# Patient Record
Sex: Male | Born: 1940 | Hispanic: No | Marital: Married | Smoking: Never smoker
Health system: Southern US, Community
[De-identification: ages and names within clinical notes are randomized; demographics above are authoritative.]

## PROBLEM LIST (undated history)

## (undated) DIAGNOSIS — I10 Essential (primary) hypertension: Secondary | ICD-10-CM

## (undated) DIAGNOSIS — Z974 Presence of external hearing-aid: Secondary | ICD-10-CM

## (undated) DIAGNOSIS — K635 Polyp of colon: Secondary | ICD-10-CM

## (undated) DIAGNOSIS — I499 Cardiac arrhythmia, unspecified: Secondary | ICD-10-CM

## (undated) DIAGNOSIS — K649 Unspecified hemorrhoids: Secondary | ICD-10-CM

## (undated) DIAGNOSIS — I4891 Unspecified atrial fibrillation: Secondary | ICD-10-CM

## (undated) DIAGNOSIS — M1990 Unspecified osteoarthritis, unspecified site: Secondary | ICD-10-CM

## (undated) DIAGNOSIS — C4490 Unspecified malignant neoplasm of skin, unspecified: Secondary | ICD-10-CM

## (undated) DIAGNOSIS — G4730 Sleep apnea, unspecified: Secondary | ICD-10-CM

## (undated) DIAGNOSIS — H905 Unspecified sensorineural hearing loss: Secondary | ICD-10-CM

## (undated) DIAGNOSIS — N433 Hydrocele, unspecified: Secondary | ICD-10-CM

## (undated) HISTORY — DX: Unspecified hemorrhoids: K64.9

## (undated) HISTORY — DX: Polyp of colon: K63.5

## (undated) HISTORY — DX: Unspecified malignant neoplasm of skin, unspecified: C44.90

## (undated) HISTORY — PX: TONSILLECTOMY: SUR1361

## (undated) HISTORY — DX: Sleep apnea, unspecified: G47.30

## (undated) HISTORY — PX: HYDROCELE EXCISION / REPAIR: SUR1145

## (undated) HISTORY — PX: MOHS SURGERY: SUR867

## (undated) HISTORY — DX: Unspecified sensorineural hearing loss: H90.5

---
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
## 1998-04-25 DIAGNOSIS — G4730 Sleep apnea, unspecified: Secondary | ICD-10-CM

## 1998-04-25 HISTORY — DX: Sleep apnea, unspecified: G47.30

## 2005-04-25 HISTORY — PX: CHOLECYSTECTOMY: SHX55

## 2010-04-25 HISTORY — PX: COLONOSCOPY: SHX174

## 2010-10-01 ENCOUNTER — Ambulatory Visit: Payer: Self-pay | Admitting: Gastroenterology

## 2010-10-01 DIAGNOSIS — I499 Cardiac arrhythmia, unspecified: Secondary | ICD-10-CM

## 2010-10-05 LAB — PATHOLOGY REPORT

## 2014-11-08 ENCOUNTER — Emergency Department
Admission: EM | Admit: 2014-11-08 | Discharge: 2014-11-09 | Disposition: A | Discharge status: Home / Self Care | Payer: Medicare HMO | Attending: Emergency Medicine | Admitting: Emergency Medicine

## 2014-11-08 ENCOUNTER — Other Ambulatory Visit: Payer: Self-pay

## 2014-11-08 ENCOUNTER — Encounter: Payer: Self-pay | Admitting: Emergency Medicine

## 2014-11-08 DIAGNOSIS — M545 Low back pain, unspecified: Secondary | ICD-10-CM

## 2014-11-08 DIAGNOSIS — Z79899 Other long term (current) drug therapy: Secondary | ICD-10-CM | POA: Diagnosis not present

## 2014-11-08 DIAGNOSIS — R1084 Generalized abdominal pain: Secondary | ICD-10-CM | POA: Diagnosis not present

## 2014-11-08 HISTORY — DX: Unspecified atrial fibrillation: I48.91

## 2014-11-08 LAB — COMPREHENSIVE METABOLIC PANEL
ALK PHOS: 89 U/L (ref 38–126)
ALT: 28 U/L (ref 17–63)
AST: 28 U/L (ref 15–41)
Albumin: 4.4 g/dL (ref 3.5–5.0)
Anion gap: 9 (ref 5–15)
BUN: 16 mg/dL (ref 6–20)
CO2: 26 mmol/L (ref 22–32)
Calcium: 9.1 mg/dL (ref 8.9–10.3)
Chloride: 100 mmol/L — ABNORMAL LOW (ref 101–111)
Creatinine, Ser: 0.81 mg/dL (ref 0.61–1.24)
GFR calc Af Amer: >60 mL/min (ref >60)
GFR calc non Af Amer: >60 mL/min (ref >60)
GLUCOSE: 113 mg/dL — AB (ref 65–99)
POTASSIUM: 3.5 mmol/L (ref 3.5–5.1)
SODIUM: 135 mmol/L (ref 135–145)
TOTAL PROTEIN: 7.7 g/dL (ref 6.5–8.1)
Total Bilirubin: 1 mg/dL (ref 0.3–1.2)

## 2014-11-08 LAB — URINALYSIS COMPLETE WITH MICROSCOPIC (ARMC ONLY)
Bacteria, UA: NONE SEEN
Bilirubin Urine: NEGATIVE
Glucose, UA: NEGATIVE mg/dL
Hgb urine dipstick: NEGATIVE
KETONES UR: NEGATIVE mg/dL
Leukocytes, UA: NEGATIVE
NITRITE: NEGATIVE
PH: 7 (ref 5.0–8.0)
PROTEIN: NEGATIVE mg/dL
Specific Gravity, Urine: 1.012 (ref 1.005–1.030)
Squamous Epithelial / LPF: NONE SEEN
WBC UA: NONE SEEN WBC/hpf (ref 0–5)

## 2014-11-08 LAB — CBC WITH DIFFERENTIAL/PLATELET
BASOS ABS: 0.1 10*3/uL (ref 0–0.1)
Basophils Relative: 1 %
EOS PCT: 1 %
Eosinophils Absolute: 0.1 10*3/uL (ref 0–0.7)
HEMATOCRIT: 43.4 % (ref 40.0–52.0)
Hemoglobin: 15.1 g/dL (ref 13.0–18.0)
LYMPHS ABS: 1.6 10*3/uL (ref 1.0–3.6)
LYMPHS PCT: 21 %
MCH: 30 pg (ref 26.0–34.0)
MCHC: 34.8 g/dL (ref 32.0–36.0)
MCV: 86.3 fL (ref 80.0–100.0)
Monocytes Absolute: 0.9 10*3/uL (ref 0.2–1.0)
Monocytes Relative: 12 %
NEUTROS ABS: 5.1 10*3/uL (ref 1.4–6.5)
Neutrophils Relative %: 65 %
Platelets: 148 10*3/uL — ABNORMAL LOW (ref 150–440)
RBC: 5.03 MIL/uL (ref 4.40–5.90)
RDW: 15.7 % — ABNORMAL HIGH (ref 11.5–14.5)
WBC: 7.9 10*3/uL (ref 3.8–10.6)

## 2014-11-08 LAB — APTT: aPTT: 44 seconds — ABNORMAL HIGH (ref 24–36)

## 2014-11-08 LAB — TROPONIN I: Troponin I: <0.03 ng/mL (ref <0.031)

## 2014-11-08 NOTE — ED Notes (Signed)
Pt presents to ER alert and in NAD. Pt states right side back pain, denies injury. Pt reports pain radiates to other side. Pt also reports generalized lower abd pain.Pt denies n/v/d. Pt denies dysuria.

## 2014-11-09 ENCOUNTER — Emergency Department: Payer: Medicare HMO

## 2014-11-09 ENCOUNTER — Encounter: Payer: Self-pay | Admitting: Emergency Medicine

## 2014-11-09 MED ORDER — HYDROMORPHONE HCL 1 MG/ML IJ SOLN
0.5000 mg | INTRAMUSCULAR | Status: DC | PRN
  Administered 2014-11-09: 0.5 mg via INTRAVENOUS
  Filled 2014-11-09: qty 1

## 2014-11-09 MED ORDER — METOCLOPRAMIDE HCL 5 MG/ML IJ SOLN
10.0000 mg | Freq: Once | INTRAMUSCULAR | Status: AC
  Administered 2014-11-09: 10 mg via INTRAVENOUS
  Filled 2014-11-09: qty 2

## 2014-11-09 MED ORDER — IOHEXOL 300 MG/ML  SOLN
125.0000 mL | Freq: Once | INTRAMUSCULAR | Status: AC | PRN
  Administered 2014-11-09: 125 mL via INTRAVENOUS

## 2014-11-09 MED ORDER — OXYCODONE-ACETAMINOPHEN 5-325 MG PO TABS
1.0000 | ORAL_TABLET | Freq: Once | ORAL | Status: AC
  Administered 2014-11-09: 1 via ORAL
  Filled 2014-11-09: qty 1

## 2014-11-09 MED ORDER — OXYCODONE-ACETAMINOPHEN 5-325 MG PO TABS: 1.0000 | ORAL_TABLET | Freq: Four times a day (QID) | ORAL | Status: DC | PRN

## 2014-11-09 MED ORDER — IOHEXOL 240 MG/ML SOLN
25.0000 mL | Freq: Once | INTRAMUSCULAR | Status: AC | PRN
  Administered 2014-11-09: 25 mL via ORAL

## 2014-11-09 MED ORDER — SODIUM CHLORIDE 0.9 % IV BOLUS (SEPSIS)
1000.0000 mL | Freq: Once | INTRAVENOUS | Status: AC
  Administered 2014-11-09: 1000 mL via INTRAVENOUS

## 2014-11-09 NOTE — Discharge Instructions (Signed)
Initially, we are focused on your pain that began in your back, Retrovir abdomen, and came around to the other side of your back. With your anticoagulation, we obtain a CT scan. That looks good. Your pain was worse when he rolled over or moved. Take Percocet if needed for uncontrolled pain. Continue light activity if tolerated. Follow-up with regular doctor this coming week. Return to the emergency department if you have worsening pain, if you have nausea vomiting with the pain, or if you have other urgent concerns.  Back Pain, Adult Low back pain is very common. About 1 in 5 people have back pain.The cause of low back pain is rarely dangerous. The pain often gets better over time.About half of people with a sudden onset of back pain feel better in just 2 weeks. About 8 in 10 people feel better by 6 weeks.  CAUSES Some common causes of back pain include:  Strain of the muscles or ligaments supporting the spine.  Wear and tear (degeneration) of the spinal discs.  Arthritis.  Direct injury to the back. DIAGNOSIS Most of the time, the direct cause of low back pain is not known.However, back pain can be treated effectively even when the exact cause of the pain is unknown.Answering your caregiver's questions about your overall health and symptoms is one of the most accurate ways to make sure the cause of your pain is not dangerous. If your caregiver needs more information, he or she may order lab work or imaging tests (X-rays or MRIs).However, even if imaging tests show changes in your back, this usually does not require surgery. HOME CARE INSTRUCTIONS For many people, back pain returns.Since low back pain is rarely dangerous, it is often a condition that people can learn to [facility] their own.   Remain active. It is stressful on the back to sit or stand in one place. Do not sit, drive, or stand in one place for more than 30 minutes at a time. Take short walks on level surfaces as soon as pain  allows.Try to increase the length of time you walk each day.  Do not stay in bed.Resting more than 1 or 2 days can delay your recovery.  Do not avoid exercise or work.Your body is made to move.It is not dangerous to be active, even though your back may hurt.Your back will likely heal faster if you return to being active before your pain is gone.  Pay attention to your body when you bend and lift. Many people have less discomfortwhen lifting if they bend their knees, keep the load close to their bodies,and avoid twisting. Often, the most comfortable positions are those that put less stress on your recovering back.  Find a comfortable position to sleep. Use a firm mattress and lie on your side with your knees slightly bent. If you lie on your back, put a pillow under your knees.  Only take over-the-counter or prescription medicines as directed by your caregiver. Over-the-counter medicines to reduce pain and inflammation are often the most helpful.Your caregiver may prescribe muscle relaxant drugs.These medicines help dull your pain so you can more quickly return to your normal activities and healthy exercise.  Put ice on the injured area.  Put ice in a plastic bag.  Place a towel between your skin and the bag.  Leave the ice on for 15-20 minutes, 03-04 times a day for the first 2 to 3 days. After that, ice and heat may be alternated to reduce pain and spasms.  Ask your caregiver about trying back exercises and gentle massage. This may be of some benefit.  Avoid feeling anxious or stressed.Stress increases muscle tension and can worsen back pain.It is important to recognize when you are anxious or stressed and learn ways to manage it.Exercise is a great option. SEEK MEDICAL CARE IF:  You have pain that is not relieved with rest or medicine.  You have pain that does not improve in 1 week.  You have new symptoms.  You are generally not feeling well. SEEK IMMEDIATE MEDICAL CARE  IF:   You have pain that radiates from your back into your legs.  You develop new bowel or bladder control problems.  You have unusual weakness or numbness in your arms or legs.  You develop nausea or vomiting.  You develop abdominal pain.  You feel faint. Document Released: 04/11/2005 Document Revised: 10/11/2011 Document Reviewed: 08/13/2013 [facility] Patient Information 2015 [place], [place]. This information is not intended to replace advice given to you by your health care provider. Make sure you discuss any questions you have with your health care provider.

## 2014-11-09 NOTE — ED Notes (Signed)
Pt returned to room from CT Scan.

## 2014-11-09 NOTE — ED Provider Notes (Signed)
[facility] Emergency Department Provider Note  ____________________________________________  Time seen: 00 20   I have reviewed the triage vital signs and the nursing notes.   HISTORY  Chief Complaint Back Pain and Abdominal Pain     HPI Tyler Ritter is a 74 y.o. male who presents with pain from his back around his belly to his back again. This set of symptoms began on Saturday morning, today. It has continued. It is fairly steady. He reports rather bothersome. Mr. Chhim says it began on the right and wrapped around the belly to the left. He has not had any musculoskeletal strain recently and is not complaining of pain with movement. He denies any nausea or vomiting or diarrhea. He has had a normal bowel movement this afternoon.  The patient does have a history of atrial fibrillation. He is on Xarelto for anticoagulation. He has recently had an increase in dyspnea on exertion. Due to this he has had a stress test and an echocardiogram just this past Friday with Dr. Ubaldo Glassing.   Past Medical History  Diagnosis Date  . Atrial fibrillation     There are no active problems to display for this patient.   Past Surgical History  Procedure Laterality Date  . Hernia repair    . Cholecystectomy      Current Outpatient Rx  Name  Route  Sig  Dispense  Refill  . diltiazem (DILACOR XR) 120 MG 24 hr capsule   Oral   Take 120 mg by mouth daily.         . rivaroxaban (XARELTO) 20 MG TABS tablet   Oral   Take 20 mg by mouth daily with supper.         Marland Kitchen oxyCODONE-acetaminophen (ROXICET) 5-325 MG per tablet   Oral   Take 1 tablet by mouth every 6 (six) hours as needed.   12 tablet   0     Allergies Review of patient's allergies indicates no known allergies.  History reviewed. No pertinent family history.  Social History History  Substance Use Topics  . Smoking status: Never Smoker   . Smokeless tobacco: Not on file  . Alcohol Use: No     Review of Systems  Constitutional: Negative for fever. ENT: Negative for sore throat. Cardiovascular: History of atrial fibrillation. Recent stress test and echocardiogram.  Respiratory: Recent dyspnea on exertion. Gastrointestinal: Negative vomiting and diarrhea. Positive for nonspecific, diffuse abdominal pain and back pain. See history of present illness Genitourinary: Negative for dysuria. Musculoskeletal: Discomfort, pain located in his back. No pain with movement though. See history of present illness Skin: Negative for rash. Neurological: Negative for headaches   10-point ROS otherwise negative.  ____________________________________________   PHYSICAL EXAM:  VITAL SIGNS: ED Triage Vitals  Enc Vitals Group     BP 11/08/14 2101 161/141 mmHg     Pulse Rate 11/08/14 2101 59     Resp 11/08/14 2101 20     Temp 11/08/14 2101 98.2 F (36.8 C)     Temp Source 11/08/14 2101 Oral     SpO2 11/08/14 2101 98 %     Weight 11/08/14 2101 259 lb (117.482 kg)     Height 11/08/14 2101 6' (1.829 m)     Head Cir --      Peak Flow --      Pain Score 11/08/14 2102 6     Pain Loc --      Pain Edu? --  Excl. in [place]? --     Constitutional: Alert and oriented. Well appearing and in no distress. ENT   Head: Normocephalic and atraumatic.   Nose: No congestion/rhinnorhea.   Mouth/Throat: Mucous membranes are moist. Cardiovascular: Irregularly irregular rhythm at a rate of 64. Respiratory:  Normal respiratory effort, no tachypnea.    Breath sounds are clear and equal bilaterally.  Gastrointestinal: Soft and nontender. No distention.  Back: No muscle spasm, no tenderness, no CVA tenderness. Musculoskeletal: No deformity noted. Nontender with normal range of motion in all extremities.  No noted edema. Neurologic:  Normal speech and language. No gross focal neurologic deficits are appreciated.  Skin:  Skin is warm, dry. No rash noted. Psychiatric: Mood and affect are  normal. Speech and behavior are normal.  ____________________________________________    LABS (pertinent positives/negatives)  Labs Reviewed  CBC WITH DIFFERENTIAL/PLATELET - Abnormal; Notable for the following:    RDW 15.7 (*)    Platelets 148 (*)    All other components within normal limits  COMPREHENSIVE METABOLIC PANEL - Abnormal; Notable for the following:    Chloride 100 (*)    Glucose, Bld 113 (*)    All other components within normal limits  URINALYSIS COMPLETEWITH MICROSCOPIC ([facility] ONLY) - Abnormal; Notable for the following:    Color, Urine YELLOW (*)    APPearance HAZY (*)    All other components within normal limits  APTT - Abnormal; Notable for the following:    aPTT 44 (*)    All other components within normal limits  TROPONIN I     ____________________________________________   EKG  ED ECG REPORT I, Bentli Llorente W, the attending physician, personally viewed and interpreted this ECG.   Date: 11/09/2014  EKG Time: 2114  Rate: 62  Rhythm:Atrial fibrillation    Axis: Normal  Intervals: Normal  ST&T Change: None   ____________________________________________    RADIOLOGY  CT scan abdomen and pelvis: IMPRESSION: No acute process demonstrated in the abdomen or pelvis. No evidence of abdominal or retroperitoneal hematomas. Small pericardial effusion. Diffuse fatty infiltration of the liver. Nonobstructing stones in the right kidney. Prostate enlargement. Left inguinal hernia containing fat. ____________________________________________   INITIAL IMPRESSION / ASSESSMENT AND PLAN / ED COURSE  Pertinent labs & imaging results that were available during my care of the patient were reviewed by me and considered in my medical decision making (see chart for details).   Pleasant 74 year old male in no acute distress, but complaining of persistent ache and discomfort in his back and abdomen. It does not appear to be any worse when he sits up or lays  back. He has no tenderness on palpation. I do not note any muscle spasm. I'm concerned that the patient could have some form of hemorrhage. He is on Xarelto. I am also concerned due to the vague nature of his discomfort of a possible vascular pathology. With these 2 concerns in mind, we will obtain a CT scan through his abdomen and pelvis.  ----------------------------------------- 3:32 AM on 11/09/2014 -----------------------------------------  CT scan is overall negative for any acute process. Reassessment of the patient finds him nauseous with pain returning. I will treat him with Reglan for the nausea. This may help with pain as well as I think he may be having intestinal spasm.  ----------------------------------------- 5:16 AM on 11/09/2014 -----------------------------------------  The patient's nausea is gone status post Reglan, however he still reports having some discomfort when he moves and turns over. With this discussion, it seems more that this pain may  be musculoskeletal. His CT, as noted above, was negative.  His urine was negative. I will treat him with some pain medication and discharge him home. We'll he and his wife agree with this. We discussed having him follow-up with his regular doctor, Dr. Ouida Sills.    ____________________________________________   FINAL CLINICAL IMPRESSION(S) / ED DIAGNOSES  Final diagnoses:  Bilateral low back pain without sciatica  Generalized abdominal pain      Ahmed Prima, MD 11/09/14 [phone]

## 2014-11-09 NOTE — ED Notes (Signed)
In to round on pt; up to toilet in room to void;

## 2014-11-09 NOTE — ED Notes (Signed)
Pt reports pain across his mid to lower back that occasionally radiates around his abd. Pt denies other sx except the pain. Pt denies injury. Pt reports his last bm was &/16/16 and was normal formed stool.

## 2015-10-19 ENCOUNTER — Encounter: Payer: Self-pay | Admitting: General Surgery

## 2015-10-19 ENCOUNTER — Ambulatory Visit (INDEPENDENT_AMBULATORY_CARE_PROVIDER_SITE_OTHER): Payer: Medicare HMO | Admitting: General Surgery

## 2015-10-19 VITALS — BP 156/80 | HR 56 | Resp 16 | Ht 71.0 in | Wt 264.0 lb

## 2015-10-19 DIAGNOSIS — L723 Sebaceous cyst: Secondary | ICD-10-CM

## 2015-10-19 NOTE — Patient Instructions (Addendum)
Keep area clean May shower May remove dressing in 2-3 days and cover with bandaid

## 2015-10-19 NOTE — Progress Notes (Signed)
Patient ID: Tyler Ritter., male   DOB: 10-24-1940, 75 y.o.   MRN: [id number]  Chief Complaint  Patient presents with  . Cyst    HPI Tyler Ritter. is a 75 y.o. male.  Here today for evaluation of a inflamed cyst mid lower back. He states he had a similar episode 3 years ago that was in similar location laced by [employer]. He has had no troubles until 3 weeks ago. He states he was on a mission trip when it started "flaring up" and swelling. He states it started draining last Wednesday and feels better. He is currently on Augmentin.  I personally reviewed the patient's history.  HPI  Past Medical History  Diagnosis Date  . Atrial fibrillation ([place])   . 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 Central hearing loss   . Colon polyp   . Hemorrhoid   . Sleep apnea     Past Surgical History  Procedure Laterality Date  . Colonoscopy  2012  . Cholecystectomy  2007  . Hernia repair  1952    Family History  Problem Relation Age of Onset  . Cancer Father 42    colon    Social History Social History  Substance Use Topics  . Smoking status: Never Smoker   . Smokeless tobacco: Never Used  . Alcohol Use: No    No Known Allergies  Current Outpatient Prescriptions  Medication Sig Dispense Refill  . amoxicillin-clavulanate (AUGMENTIN) 875-125 MG tablet Take 1 tablet by mouth 2 (two) times daily.     . calcium citrate-vitamin D (CITRACAL+D) 315-200 MG-UNIT tablet Take 1 tablet by mouth daily.     . Cholecalciferol (VITAMIN D3) 2000 units capsule Take by mouth.    . diltiazem (DILACOR XR) 120 MG 24 hr capsule Take 120 mg by mouth daily.    . Omega-3 Fatty Acids (FISH OIL PO) Take by mouth daily.     . rivaroxaban (XARELTO) 20 MG TABS tablet Take 20 mg by mouth daily with supper.    . vitamin C (ASCORBIC ACID) 500 MG tablet Take by mouth.     No current facility-administered medications for this visit.    Review of Systems Review of Systems   Constitutional: Negative.   Respiratory: Negative.   Cardiovascular: Negative.     Blood pressure 156/80, pulse 56, resp. rate 16, height 5\' 11"  (1.803 m), weight 264 lb (119.75 kg).  Physical Exam Physical Exam  Constitutional: He is oriented to person, place, and time. He appears well-developed and well-nourished.  Musculoskeletal:       Back:  Neurological: He is alert and oriented to person, place, and time.  Skin: Skin is warm and dry.  3 cm area of redness right of mid line at L1  Psychiatric: His behavior is normal.    Data Reviewed PCP notes of 10/13/2015 reviewed. 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 Sleep apnea.  Laboratory studies from March 2017 reviewed. Normal, Marene Lenz metabolic panel.  Assessment    Resolving inflammation and a sebaceous cyst.    Plan    The area was significantly quiescent to consider primary excision. A total of 20 mL of 0.5% Xylocaine with 0.25% Marcaine with 1-200,000 of epinephrine was utilized well tolerated. ChloraPrep was applied to the skin. An elliptical incision was used to remove the central core. The cyst was removed with complete retrieval of the wall. Hemostasis was with 3-0 Vicryl suture ligature. The adipose layer was approximated  with interrupted 3-0 Vicryl sutures. The skin was closed with a running 4-0 proline suture. Telfa and Tegaderm dressing applied.  Postbiopsy wound care reviewed.     The patient will return in 9-10 days for suture removal.   PCP:  Tyler Ritter This information has been scribed by Karie Fetch RN, BSN,BC.   [name] 10/20/2015, 6:57 PM

## 2015-10-20 DIAGNOSIS — L723 Sebaceous cyst: Secondary | ICD-10-CM | POA: Insufficient documentation

## 2015-10-22 ENCOUNTER — Telehealth: Payer: Self-pay

## 2015-10-22 NOTE — Telephone Encounter (Signed)
Notified patient as instructed, patient pleased. Discussed follow-up appointments, patient agrees  

## 2015-10-22 NOTE — Telephone Encounter (Signed)
-----   Message from [name], MD sent at 10/21/2015  9:41 PM EDT ----- Notify pathology was fine. F/U as scheduled.  ----- Message -----    From: Lab in [facility] Interface    Sent: 10/21/2015   4:15 PM      To: [name], MD

## 2015-10-28 ENCOUNTER — Ambulatory Visit (INDEPENDENT_AMBULATORY_CARE_PROVIDER_SITE_OTHER): Payer: Medicare HMO

## 2015-10-28 DIAGNOSIS — L723 Sebaceous cyst: Secondary | ICD-10-CM

## 2015-10-28 NOTE — Progress Notes (Signed)
Patient ID: Tyler Gemmel., male   DOB: 1940-10-06, 75 y.o.   MRN: [id number] Patient came in today for a wound check. The wound is clean, with no signs of infection noted. Suture removed, wound edges intact. Follow up as scheduled.

## 2015-12-27 ENCOUNTER — Emergency Department: Payer: Medicare HMO

## 2015-12-27 ENCOUNTER — Encounter: Payer: Self-pay | Admitting: Emergency Medicine

## 2015-12-27 ENCOUNTER — Emergency Department
Admission: EM | Admit: 2015-12-27 | Discharge: 2015-12-27 | Disposition: A | Discharge status: Home / Self Care | Payer: Medicare HMO | Attending: Student in an Organized Health Care Education/Training Program | Admitting: Student in an Organized Health Care Education/Training Program

## 2015-12-27 DIAGNOSIS — Y9389 Activity, other specified: Secondary | ICD-10-CM | POA: Diagnosis not present

## 2015-12-27 DIAGNOSIS — Z85828 Personal history of other malignant neoplasm of skin: Secondary | ICD-10-CM | POA: Insufficient documentation

## 2015-12-27 DIAGNOSIS — Z792 Long term (current) use of antibiotics: Secondary | ICD-10-CM | POA: Insufficient documentation

## 2015-12-27 DIAGNOSIS — S82851A Displaced trimalleolar fracture of right lower leg, initial encounter for closed fracture: Secondary | ICD-10-CM | POA: Diagnosis not present

## 2015-12-27 DIAGNOSIS — W108XXA Fall (on) (from) other stairs and steps, initial encounter: Secondary | ICD-10-CM | POA: Insufficient documentation

## 2015-12-27 DIAGNOSIS — S82441A Displaced spiral fracture of shaft of right fibula, initial encounter for closed fracture: Secondary | ICD-10-CM | POA: Insufficient documentation

## 2015-12-27 DIAGNOSIS — W19XXXA Unspecified fall, initial encounter: Secondary | ICD-10-CM

## 2015-12-27 DIAGNOSIS — S0990XA Unspecified injury of head, initial encounter: Secondary | ICD-10-CM | POA: Diagnosis not present

## 2015-12-27 DIAGNOSIS — Y929 Unspecified place or not applicable: Secondary | ICD-10-CM | POA: Insufficient documentation

## 2015-12-27 DIAGNOSIS — Y999 Unspecified external cause status: Secondary | ICD-10-CM | POA: Insufficient documentation

## 2015-12-27 DIAGNOSIS — S82861A Displaced Maisonneuve's fracture of right leg, initial encounter for closed fracture: Secondary | ICD-10-CM

## 2015-12-27 DIAGNOSIS — S8991XA Unspecified injury of right lower leg, initial encounter: Secondary | ICD-10-CM | POA: Diagnosis present

## 2015-12-27 HISTORY — DX: Hydrocele, unspecified: N43.3

## 2015-12-27 MED ORDER — HYDROCODONE-ACETAMINOPHEN 5-325 MG PO TABS: 1.0000 | ORAL_TABLET | ORAL | 0 refills | Status: DC | PRN

## 2015-12-27 NOTE — ED Notes (Signed)
Pt assisted to the bathroom by this RN. NAD noted at this time. Will continue to monitor for further patient needs.

## 2015-12-27 NOTE — ED Notes (Signed)
NAD noted at this time. Pt's family at bedside. Denies any needs. Will continue to monitor for further patient needs.

## 2015-12-27 NOTE — ED Triage Notes (Signed)
Pt presents to ED s/p fall via [facility]. Per EMS pt fell down 1 stairs after missing the step. Pt states he was able to "hobble down the last flight of stairs". Per EMS pt c/o swelling and pain to R shin and ankle. Pt is currently taking xarelto and states he did hit his head, no obvious head injury noted at this time. Pt presents alert and oriented. Pt denies dizziness, changes in vision, or LOC. Pt states he head a pop as he fell.

## 2015-12-27 NOTE — ED Notes (Signed)
Ronnie, Secretary at bedside to perform ortho splint. NAD noted to patient at this time. Will continue to monitor, pt's family remains at bedside.

## 2015-12-27 NOTE — ED Notes (Signed)
Crutch training provided: two point,three point,and swing through education provided with teach back and demonstration success.

## 2015-12-27 NOTE — ED Provider Notes (Signed)
[facility] Emergency Department Provider Note    None    (approximate)  I have reviewed the triage vital signs and the nursing notes.   HISTORY  Chief Complaint Fall and Leg Pain    HPI Tyler Geen. is a 75 y.o. male presents with acute right ankle pain status post mechanical fall while stepping down steps and missing one-step. Patient fell to the ground on his face. Is uncertain of any LOC. He does takes Xarelto for history of A. fib. Denies any chest pain or shortness of breath. No abdominal pain. Since the fall he has been trying to ambulate with a walker but is unable to put any weight on the right leg. He denies any hip or pelvic pain.   Past Medical History:  Diagnosis Date  . Atrial fibrillation ([place])   . Central hearing loss   . Colon polyp   . Hemorrhoid   . Hydrocele   . Skin cancer 2012, 2013, 2015,2017   basel cell  . Sleep apnea     Patient Active Problem List   Diagnosis Date Noted  . Sebaceous cyst 10/20/2015    Past Surgical History:  Procedure Laterality Date  . CHOLECYSTECTOMY  2007  . CIRCUMCISION, NON-NEWBORN    . COLONOSCOPY  2012  . [place]  . HYDROCELE EXCISION / REPAIR    . MOHS SURGERY      Prior to Admission medications   Medication Sig Start Date End Date Taking? Authorizing Provider  amoxicillin-clavulanate (AUGMENTIN) 875-125 MG tablet Take 1 tablet by mouth 2 (two) times daily.  10/13/15   Historical Provider, MD  calcium citrate-vitamin D (CITRACAL+D) 315-200 MG-UNIT tablet Take 1 tablet by mouth daily.     Historical Provider, MD  Cholecalciferol (VITAMIN D3) 2000 units capsule Take by mouth.    Historical Provider, MD  diltiazem (DILACOR XR) 120 MG 24 hr capsule Take 120 mg by mouth daily.    Historical Provider, MD  Omega-3 Fatty Acids (FISH OIL PO) Take by mouth daily.     Historical Provider, MD  rivaroxaban (XARELTO) 20 MG TABS tablet Take 20 mg by mouth daily with supper.     Historical Provider, MD  vitamin C (ASCORBIC ACID) 500 MG tablet Take by mouth.    Historical Provider, MD    Allergies Review of patient's allergies indicates no known allergies.  Family History  Problem Relation Age of Onset  . Cancer Father 40    colon    Social History Social History  Substance Use Topics  . Smoking status: Never Smoker  . Smokeless tobacco: Never Used  . Alcohol use No    Review of Systems Patient denies headaches, rhinorrhea, blurry vision, numbness, shortness of breath, chest pain, edema, cough, abdominal pain, nausea, vomiting, diarrhea, dysuria, fevers, rashes or hallucinations unless otherwise stated above in HPI. ____________________________________________   PHYSICAL EXAM:  VITAL SIGNS: Vitals:   12/27/15 1126  BP: 138/84  Pulse: 62  Resp: 18  Temp: 97.7 F (36.5 C)    Constitutional: Alert and oriented. Well appearing and in no acute distress. Eyes: Conjunctivae are normal. PERRL. EOMI. Head: Atraumatic. Nose: No congestion/rhinnorhea. Mouth/Throat: Mucous membranes are moist.  Oropharynx non-erythematous. Neck: No stridor. Painless ROM. No cervical spine tenderness to palpation Hematological/Lymphatic/Immunilogical: No cervical lymphadenopathy. Cardiovascular: Normal rate, regular rhythm. Grossly normal heart sounds.  Good peripheral circulation. Respiratory: Normal respiratory effort.  No retractions. Lungs CTAB. Gastrointestinal: Soft and nontender. No distention. No abdominal bruits.  No CVA tenderness. Genitourinary:  Musculoskeletal: Tenderness to palpation right fibular neck as well as bilateral malleoli of the right leg. Compartments are soft. He has no effusions or significant edema. Sensation is intact to light touch. He is well-perfused Neurologic:  Normal speech and language. No gross focal neurologic deficits are appreciated. No gait instability. Skin:  Skin is warm, dry and intact. No rash noted. Psychiatric: Mood and  affect are normal. Speech and behavior are normal.  ____________________________________________   LABS (all labs ordered are listed, but only abnormal results are displayed)  No results found for this or any previous visit (from the past 24 hour(s)). ____________________________________________  EKG____________________________________________  RADIOLOGY  See chart. ____________________________________________   PROCEDURES  Procedure(s) performed:  ORTHOPEDIC INJURY TREATMENT Date/Time: 12/27/2015 1:47 PM Performed by: Merlyn Lot Authorized by: Merlyn Lot  Consent: Verbal consent obtained. Written consent not obtained. Consent given by: patient Imaging studies: imaging studies available Required items: required blood products, implants, devices, and special equipment available Patient identity confirmed: verbally with patient Injury location: ankle Location details: right ankle Injury type: fracture Fracture type: trimalleolar Pre-procedure neurovascular assessment: neurovascularly intact Pre-procedure distal perfusion: normal Pre-procedure neurological function: normal Pre-procedure range of motion: reduced Manipulation performed: no Immobilization: splint Splint type: short leg Supplies used: Ortho-Glass Post-procedure neurovascular assessment: post-procedure neurovascularly intact Post-procedure distal perfusion: normal Post-procedure neurological function: normal Post-procedure range of motion: unchanged Patient tolerance: Patient tolerated the procedure well with no immediate complications        Critical Care performed: no ____________________________________________   INITIAL IMPRESSION / ASSESSMENT AND PLAN / ED COURSE  Pertinent labs & imaging results that were available during my care of the patient were reviewed by me and considered in my medical decision making (see chart for details).  DDX: sah, sdh, edh, fracture, contusion,  soft tissue injury, viscous injury, concussion, hemorrhage   Tyler Sacksteder. is a 75 y.o. who presents to the ED with complaint of acute right ankle and leg pain after mechanical fall. Patient uncertain of any head injury but is on Xarelto. Will order CT head to evaluate for any acute traumatic injury. Chest and abdomen are otherwise atraumatic. He is hemodynamic stable will order x-ray imaging to evaluate for traumatic injury of right leg.  Clinical Course  Comment By Time  X-ray imaging with evidence of [place] acute fracture. Merlyn Lot, MD 09/03 1257   CT head with no acute intracranial normality. Patient with Mason fracture of the right lower extremity there appears to be well located without displacement. Very atypical fracture.  Patient placed in lower leg splint. Directed to remain nonweightbearing to the right lower extremity. Patient provided referral for orthopedics. Patient already anticoagulated on Xarelto. No need for additional DVT prophylaxis.  Have discussed with the patient and available family all diagnostics and treatments performed thus far and all questions were answered to the best of my ability. The patient demonstrates understanding and agreement with plan.    ____________________________________________   FINAL CLINICAL IMPRESSION(S) / ED DIAGNOSES  Final diagnoses:  [place]uve fracture of fibula, right, closed, initial encounter  Trimalleolar fracture, right, closed, initial encounter  Head injury, initial encounter      NEW MEDICATIONS STARTED DURING THIS VISIT:  New Prescriptions   No medications on file     Note:  This document was prepared using Dragon voice recognition software and may include unintentional dictation errors.    Merlyn Lot, MD 12/27/15 [phone]

## 2016-02-24 ENCOUNTER — Ambulatory Visit: Payer: Medicare HMO | Admitting: General Surgery

## 2016-03-01 ENCOUNTER — Ambulatory Visit (INDEPENDENT_AMBULATORY_CARE_PROVIDER_SITE_OTHER): Payer: Medicare HMO | Admitting: General Surgery

## 2016-03-01 ENCOUNTER — Encounter: Payer: Self-pay | Admitting: General Surgery

## 2016-03-01 VITALS — BP 152/88 | HR 62 | Resp 14 | Ht 70.0 in | Wt 261.0 lb

## 2016-03-01 DIAGNOSIS — I482 Chronic atrial fibrillation, unspecified: Secondary | ICD-10-CM | POA: Insufficient documentation

## 2016-03-01 DIAGNOSIS — K625 Hemorrhage of anus and rectum: Secondary | ICD-10-CM | POA: Diagnosis not present

## 2016-03-01 DIAGNOSIS — K649 Unspecified hemorrhoids: Secondary | ICD-10-CM

## 2016-03-01 LAB — POC HEMOCCULT BLD/STL (OFFICE/1-CARD/DIAGNOSTIC): Fecal Occult Blood, POC: POSITIVE — AB

## 2016-03-01 NOTE — Patient Instructions (Addendum)
Colonoscopy A colonoscopy is an exam to look at the entire large intestine (colon). This exam can help find problems such as tumors, polyps, inflammation, and areas of bleeding. The exam takes about 1 hour.  LET [facility] CARE PROVIDER KNOW ABOUT:  Any allergies you have. All medicines you are taking, including vitamins, herbs, eye drops, creams, and over-the-counter medicines. Previous problems you or members of your family have had with the use of anesthetics. Any blood disorders you have. Previous surgeries you have had. Medical conditions you have. RISKS AND COMPLICATIONS  Generally, this is a safe procedure. However, as with any procedure, complications can occur. Possible complications include: Bleeding. Tearing or rupture of the colon wall. Reaction to medicines given during the exam. Infection (rare). BEFORE THE PROCEDURE  Ask your health care provider about changing or stopping your regular medicines. You may be prescribed an oral bowel prep. This involves drinking a large amount of medicated liquid, starting the day before your procedure. The liquid will cause you to have multiple loose stools until your stool is almost clear or light green. This cleans out your colon in preparation for the procedure. Do not eat or drink anything else once you have started the bowel prep, unless your health care provider tells you it is safe to do so. Arrange for someone to drive you home after the procedure. PROCEDURE  You will be given medicine to help you relax (sedative). You will lie on your side with your knees bent. A long, flexible tube with a light and camera on the end (colonoscope) will be inserted through the rectum and into the colon. The camera sends video back to a computer screen as it moves through the colon. The colonoscope also releases carbon dioxide gas to inflate the colon. This helps your health care provider see the area better. During the exam, your health care provider  may take a small tissue sample (biopsy) to be examined under a microscope if any abnormalities are found. The exam is finished when the entire colon has been viewed. AFTER THE PROCEDURE  Do not drive for 24 hours after the exam. You may have a small amount of blood in your stool. You may pass moderate amounts of gas and have mild abdominal cramping or bloating. This is caused by the gas used to inflate your colon during the exam. Ask when your test results will be ready and how you will get your results. Make sure you get your test results.   This information is not intended to replace advice given to you by your health care provider. Make sure you discuss any questions you have with your health care provider.   Document Released: 04/08/2000 Document Revised: 01/30/2013 Document Reviewed: 12/17/2012 Elsevier Interactive Patient Education 2016 Elsevier Inc. Nonsurgical Procedures for Hemorrhoids Nonsurgical procedures can be used to treat hemorrhoids. Hemorrhoids are swollen veins that are inside the rectum (internal hemorrhoids) or around the anus (external hemorrhoids). They are caused by increased pressure in the anal area. This pressure may result from straining to have a bowel movement (constipation), diarrhea, pregnancy, obesity, anal sex, or sitting for long periods of time. Hemorrhoids can cause symptoms such as pain and bleeding. Various procedures may be performed if diet changes, lifestyle changes, and other treatments do not help your symptoms. Some of these procedures do not involve surgery. Three common nonsurgical procedures are:  Rubber band ligation. Rubber bands are used to cut off the blood supply to the hemorrhoids.  Sclerotherapy. Medicine is injected  into the hemorrhoids to shrink them.  Infrared coagulation. A type of light energy is used to get rid of the hemorrhoids. LET [facility] CARE PROVIDER KNOW ABOUT:  Any allergies you have.  All medicines you are taking,  including vitamins, herbs, eye drops, creams, and over-the-counter medicines.  Previous problems you or members of your family have had with the use of anesthetics.  Any blood disorders you have.  Previous surgeries you have had.  Any medical conditions you have.  Whether you are pregnant or may be pregnant. RISKS AND COMPLICATIONS Generally, this is a safe procedure. However, problems may occur, including:  Infection.  Bleeding.  Pain. BEFORE THE PROCEDURE  Ask your health care provider about:  Changing or stopping your regular medicines. This is especially important if you are taking diabetes medicines or blood thinners.  Taking medicines such as aspirin and ibuprofen. These medicines can thin your blood. Do not take these medicines before your procedure if your health care provider instructs you not to.  You may need to have a procedure to examine the inside of your colon with a scope (colonoscopy). Your health care provider may do this to make sure that there are no other causes for your bleeding or pain. PROCEDURE  Your health care provider will clean your rectal area with a rinsing solution.  A lubricating jelly may be placed into your rectum. The jelly may contain a medicine to numb the area (local anesthetic).  Your health care provider will insert a short scope (anoscope) into your rectum to examine the hemorrhoids.  One of the following techniques will be used. Rubber Band Ligation Your health care provider will place medical instruments through the scope to put rubber bands around the base of your hemorrhoids. The bands will cut off the blood supply to the hemorrhoids. The hemorrhoids will fall off after several days. Sclerotherapy Your health care provider will inject medicine through the scope into your hemorrhoids. This will cause them to shrink and dry up. Infrared Coagulation Your health care provider will shine a type of light through the scope onto your  hemorrhoids. This light will generate energy (infrared radiation). It will cause the hemorrhoids to scar and then fall off. Each of these procedures may vary among health care providers and hospitals. AFTER THE PROCEDURE  You will be monitored to make sure that you have no bleeding.  Return to your normal activities as told by your health care provider.   This information is not intended to replace advice given to you by your health care provider. Make sure you discuss any questions you have with your health care provider.   Document Released: 02/06/2009 Document Revised: 12/31/2014 Document Reviewed: 07/07/2014 Elsevier Interactive Patient Education [employer].  The patient is scheduled for a Colonoscopy and hemorrhoid banding at [facility] on 03/22/16. They are aware to call the day before to get their arrival time. The patient already has his Miralax prescription. He will stop his Fish Oil 1 week prior. He will stop his Xarelto 2 days prior. He will only take his Diltiazem by 8 am the morning of. He will bring his C-Pap machine with him. The patient is aware of date and instructions.

## 2016-03-01 NOTE — Progress Notes (Signed)
Patient ID: Tyler Ritter., male   DOB: 1940/11/12, 75 y.o.   MRN: [id number]  Chief Complaint  Patient presents with  . Other    hemorrhoids    HPI Tyler Ritter. is a 75 y.o. male here today for a evaluation of hemorrhoids. He states this has been going on for a years now. He noticed some anal bleeding when he wipes and in the bowel, no pain. Marland Kitchen He has tried some cream and has not been working for him. Last colonoscopy was done in 2012. HPI  Past Medical History:  Diagnosis Date  . Atrial fibrillation ([place])   . Central hearing loss   . Colon polyp   . Hemorrhoid   . Hydrocele   . Skin cancer 2012, 2013, 2015,2017   basel cell  . Sleep apnea 2000   Uses Cpap machine    Past Surgical History:  Procedure Laterality Date  . CHOLECYSTECTOMY  2007  . CIRCUMCISION, NON-NEWBORN    . COLONOSCOPY  2012  . [place]  . HYDROCELE EXCISION / REPAIR    . MOHS SURGERY      Family History  Problem Relation Age of Onset  . Cancer Father 30    colon    Social History Social History  Substance Use Topics  . Smoking status: Never Smoker  . Smokeless tobacco: Never Used  . Alcohol use No    No Known Allergies  Current Outpatient Prescriptions  Medication Sig Dispense Refill  . calcium citrate-vitamin D (CITRACAL+D) 315-200 MG-UNIT tablet Take 1 tablet by mouth daily.     . Cholecalciferol (VITAMIN D3) 2000 units capsule Take by mouth.    . diltiazem (DILACOR XR) 120 MG 24 hr capsule Take 120 mg by mouth daily.    . Omega-3 Fatty Acids (FISH OIL PO) Take by mouth daily.     . rivaroxaban (XARELTO) 20 MG TABS tablet Take 20 mg by mouth daily with supper.    . vitamin C (ASCORBIC ACID) 500 MG tablet Take by mouth.     No current facility-administered medications for this visit.     Review of Systems Review of Systems  Constitutional: Negative.   Respiratory: Negative.   Cardiovascular: Negative.     Blood pressure (!) 152/88, pulse 62, resp. rate  14, height 5\' 10"  (1.778 m), weight 261 lb (118.4 kg).  Physical Exam Physical Exam  Constitutional: He is oriented to person, place, and time. He appears well-developed and well-nourished.  Eyes: Conjunctivae are normal. No scleral icterus.  Neck: Neck supple.  Cardiovascular: Normal rate, regular rhythm and normal heart sounds.   Pulmonary/Chest: Effort normal and breath sounds normal.  Abdominal: Soft. Normal appearance and bowel sounds are normal. There is no hepatomegaly. There is no tenderness. No hernia.  Genitourinary: Rectal exam shows external hemorrhoid, internal hemorrhoid and guaiac positive stool (no gross blood, heme positive.).     Lymphadenopathy:    He has no cervical adenopathy.  Neurological: He is alert and oriented to person, place, and time.  Skin: Skin is warm and dry.     Right mid back abscess site is well healed.    Data Reviewed Sigmoid polyp removed at the time of his 10/01/2010 colonoscopy showed benign lymphoid aggregate.  Anoscopy showed prominent internal hemorrhoids. No active bleeding.  Assessment    Family history colon cancer.  Heme positive stools.  Internal hemorrhoids.    Plan    The patient is aware that discontinuing his anticoagulation  will transiently increase his risk for stroke.     Colonoscopy with possible biopsy/polypectomy prn: Information regarding the procedure, including its potential risks and complications (including but not limited to perforation of the bowel, which may require emergency surgery to repair, and bleeding) was verbally given to the patient. Educational information regarding lower intestinal endoscopy was given to the patient. Written instructions for how to complete the bowel prep using Miralax were provided. The importance of drinking ample fluids to avoid dehydration as a result of the prep emphasized.   Hemorrhoid banding t the time of the colonoscopy.  The patient is scheduled for a Colonoscopy and  hemorrhoid banding at [facility] on 03/22/16. They are aware to call the day before to get their arrival time. The patient already has his Miralax prescription. He will stop his Fish Oil 1 week prior. He will stop his Xarelto 2 days prior. He will only take his Diltiazem by 8 am the morning of. He will bring his C-Pap machine with him. The patient is aware of date and instructions.   This information has been scribed by Gaspar Cola CMA.   [name] 03/01/2016, 8:42 PM

## 2016-03-22 ENCOUNTER — Ambulatory Visit
Admission: RE | Admit: 2016-03-22 | Discharge: 2016-03-22 | Disposition: A | Discharge status: Home / Self Care | Payer: Medicare HMO | Source: Ambulatory Visit | Attending: General Surgery | Admitting: General Surgery

## 2016-03-22 ENCOUNTER — Ambulatory Visit: Payer: Medicare HMO | Admitting: Anesthesiology

## 2016-03-22 ENCOUNTER — Encounter: Payer: Self-pay | Admitting: *Deleted

## 2016-03-22 ENCOUNTER — Encounter
Admission: RE | Disposition: A | Discharge status: Home / Self Care | Payer: Self-pay | Source: Ambulatory Visit | Attending: General Surgery

## 2016-03-22 DIAGNOSIS — E669 Obesity, unspecified: Secondary | ICD-10-CM | POA: Insufficient documentation

## 2016-03-22 DIAGNOSIS — Z800 Family history of malignant neoplasm of digestive organs: Secondary | ICD-10-CM | POA: Diagnosis not present

## 2016-03-22 DIAGNOSIS — Z85828 Personal history of other malignant neoplasm of skin: Secondary | ICD-10-CM | POA: Insufficient documentation

## 2016-03-22 DIAGNOSIS — K641 Second degree hemorrhoids: Secondary | ICD-10-CM

## 2016-03-22 DIAGNOSIS — G4730 Sleep apnea, unspecified: Secondary | ICD-10-CM | POA: Diagnosis not present

## 2016-03-22 DIAGNOSIS — K625 Hemorrhage of anus and rectum: Secondary | ICD-10-CM

## 2016-03-22 DIAGNOSIS — J449 Chronic obstructive pulmonary disease, unspecified: Secondary | ICD-10-CM | POA: Diagnosis not present

## 2016-03-22 HISTORY — PX: COLONOSCOPY WITH PROPOFOL: SHX5780

## 2016-03-22 HISTORY — DX: Cardiac arrhythmia, unspecified: I49.9

## 2016-03-22 SURGERY — COLONOSCOPY WITH PROPOFOL
Anesthesia: General

## 2016-03-22 MED ORDER — PROPOFOL 500 MG/50ML IV EMUL
INTRAVENOUS | Status: DC | PRN
  Administered 2016-03-22: 120 ug/kg/min via INTRAVENOUS

## 2016-03-22 MED ORDER — LIDOCAINE 2% (20 MG/ML) 5 ML SYRINGE
INTRAMUSCULAR | Status: DC | PRN
  Administered 2016-03-22: 25 mg via INTRAVENOUS

## 2016-03-22 MED ORDER — PROPOFOL 10 MG/ML IV BOLUS
INTRAVENOUS | Status: DC | PRN
  Administered 2016-03-22: 70 mg via INTRAVENOUS
  Administered 2016-03-22: 30 mg via INTRAVENOUS

## 2016-03-22 MED ORDER — SODIUM CHLORIDE 0.9 % IV SOLN
INTRAVENOUS | Status: DC
  Administered 2016-03-22: 13:00:00 via INTRAVENOUS

## 2016-03-22 NOTE — Op Note (Signed)
[facility]astro[facility] Patient Name: Tyler Ritter Procedure Date: 03/22/2016 1:13 PM MRN: [id number] Account #: [id number] Date of Birth: 28-Sep-1940 Admit Type: Outpatient Age: 75 Room: [facility] ENDO ROOM 4 Gender: Male Note Status: Finalized Procedure:            Colonoscopy Indications:          Hemorrhoids Providers:            [name], MD Referring MD:         Ocie Cornfield. Ouida Sills MD, MD (Referring MD) Medicines:            Monitored Anesthesia Care Complications:        No immediate complications. Procedure:            Pre-Anesthesia Assessment:                       - Prior to the procedure, a History and Physical was                        performed, and patient medications, allergies and                        sensitivities were reviewed. The patient's tolerance of                        previous anesthesia was reviewed.                       - The risks and benefits of the procedure and the                        sedation options and risks were discussed with the                        patient. All questions were answered and informed                        consent was obtained.                       After obtaining informed consent, the colonoscope was                        passed under direct vision. Throughout the procedure,                        the patient's blood pressure, pulse, and oxygen                        saturations were monitored continuously. The                        Colonoscope was introduced through the anus and                        advanced to the the cecum, identified by appendiceal                        orifice and ileocecal valve. The colonoscopy was  performed without difficulty. The patient tolerated the                        procedure well. The quality of the bowel preparation                        was excellent. Findings:      Internal hemorrhoids were found during anoscopy. The  hemorrhoids were       Grade II (internal hemorrhoids that prolapse but reduce spontaneously).       Two bands were successfully placed at the left lateral position and the       right posterior position. There was no bleeding during, and at the end,       of the procedure. Impression:           - Internal hemorrhoids. Banded.                       - No specimens collected. Recommendation:       - Return to endoscopist in 2 weeks. Procedure Code(s):    --- Professional ---                       [phone], Colonoscopy, flexible; with band ligation(s)                        (eg, hemorrhoids) Diagnosis Code(s):    --- Professional ---                       K64.1, Second degree hemorrhoids CPT copyright 2016 American Medical Association. All rights reserved. The codes documented in this report are preliminary and upon coder review may  be revised to meet current compliance requirements. [name], MD 03/22/2016 1:50:39 PM This report has been signed electronically. Number of Addenda: 0 Note Initiated On: 03/22/2016 1:13 PM Total Procedure Duration: 0 hours 16 minutes 57 seconds       [facility]

## 2016-03-22 NOTE — H&P (Signed)
No interval change in history. Tolerated prep well. For colonoscopy and possible hemorrhoid banding.

## 2016-03-22 NOTE — Transfer of Care (Signed)
Immediate Anesthesia Transfer of Care Note  Patient: Tyler Ritter.  Procedure(s) Performed: Procedure(s): COLONOSCOPY WITH PROPOFOL (N/A)  Patient Location: Endoscopy Unit  Anesthesia Type:General  Level of Consciousness: awake  Airway & Oxygen Therapy: Patient Spontanous Breathing and Patient connected to nasal cannula oxygen  Post-op Assessment: Report given to RN and Post -op Vital signs reviewed and stable  Post vital signs: Reviewed  Last Vitals:  Vitals:   03/22/16 1245 03/22/16 1341  BP: (!) 157/99 139/66  Pulse: 66 60  Resp: 20 18  Temp: 36.6 C 36.2 C    Last Pain:  Vitals:   03/22/16 1245  TempSrc: Tympanic         Complications: No apparent anesthesia complications

## 2016-03-22 NOTE — Anesthesia Preprocedure Evaluation (Signed)
Anesthesia Evaluation  Patient identified by MRN, date of birth, ID band Patient awake    Reviewed: Allergy & Precautions, NPO status , Patient's Chart, lab work & pertinent test results  History of Anesthesia Complications Negative for: history of anesthetic complications  Airway Mallampati: II  TM Distance: >3 FB Neck ROM: Full    Dental  (+) Implants   Pulmonary sleep apnea and Continuous Positive Airway Pressure Ventilation , neg COPD,    breath sounds clear to auscultation- rhonchi (-) wheezing      Cardiovascular Exercise Tolerance: Good (-) hypertension(-) CAD and (-) Past MI + dysrhythmias Atrial Fibrillation  Rhythm:Regular Rate:Normal - Systolic murmurs and - Diastolic murmurs    Neuro/Psych negative neurological ROS  negative psych ROS   GI/Hepatic negative GI ROS, Neg liver ROS,   Endo/Other  negative endocrine ROSneg diabetes  Renal/GU negative Renal ROS     Musculoskeletal negative musculoskeletal ROS (+)   Abdominal (+) + obese,   Peds  Hematology negative hematology ROS (+)   Anesthesia Other Findings Past Medical History: No date: Atrial fibrillation ([place]) No date: Central hearing loss No date: Colon polyp No date: Dysrhythmia No date: Hemorrhoid No date: Hydrocele 2012, 2013, 2015,2017: Skin cancer     Comment: basel cell 2000: Sleep apnea     Comment: Uses Cpap machine   Reproductive/Obstetrics                             Anesthesia Physical Anesthesia Plan  ASA: II  Anesthesia Plan: General   Post-op Pain Management:    Induction: Intravenous  Airway Management Planned: Natural Airway  Additional Equipment:   Intra-op Plan:   Post-operative Plan:   Informed Consent: I have reviewed the patients History and Physical, chart, labs and discussed the procedure including the risks, benefits and alternatives for the proposed anesthesia with the patient or  authorized representative who has indicated his/her understanding and acceptance.   Dental advisory given  Plan Discussed with: CRNA and Anesthesiologist  Anesthesia Plan Comments:         Anesthesia Quick Evaluation

## 2016-03-22 NOTE — Anesthesia Postprocedure Evaluation (Signed)
Anesthesia Post Note  Patient: Tyler Ritter.  Procedure(s) Performed: Procedure(s) (LRB): COLONOSCOPY WITH PROPOFOL (N/A)  Patient location during evaluation: PACU Anesthesia Type: General Level of consciousness: awake and alert and oriented Pain management: pain level controlled Vital Signs Assessment: post-procedure vital signs reviewed and stable Respiratory status: spontaneous breathing, nonlabored ventilation and respiratory function stable Cardiovascular status: blood pressure returned to baseline and stable Postop Assessment: no signs of nausea or vomiting Anesthetic complications: no    Last Vitals:  Vitals:   03/22/16 1342 03/22/16 1352  BP: 139/66 125/71  Pulse:    Resp:    Temp: 36.2 C     Last Pain:  Vitals:   03/22/16 1342  TempSrc: Tympanic                 Lillith Mcneff

## 2016-03-22 NOTE — OR Nursing (Signed)
MD band hemorrhoids after completion of colonoscopy.

## 2016-03-23 ENCOUNTER — Encounter: Payer: Self-pay | Admitting: General Surgery

## 2016-04-05 ENCOUNTER — Encounter: Payer: Self-pay | Admitting: General Surgery

## 2016-04-05 ENCOUNTER — Ambulatory Visit (INDEPENDENT_AMBULATORY_CARE_PROVIDER_SITE_OTHER): Payer: Medicare HMO | Admitting: General Surgery

## 2016-04-05 VITALS — BP 130/76 | HR 68 | Resp 14 | Ht 70.0 in | Wt 265.0 lb

## 2016-04-05 DIAGNOSIS — K625 Hemorrhage of anus and rectum: Secondary | ICD-10-CM

## 2016-04-05 NOTE — Patient Instructions (Signed)
Return as needed

## 2016-04-05 NOTE — Progress Notes (Signed)
Patient ID: Tyler Ritter., male   DOB: 05-08-1940, 75 y.o.   MRN: [id number]  Chief Complaint  Patient presents with  . Routine Post Op    colonoscvopy    HPI Tyler Ritter. is a 75 y.o. male here today for his follow up colonoscopy done on 03/22/2016. Patient states he is doing well.   The patient reports for the first several days after surgery he had a sense that he needed to void all the time. When he did void he reported good urinary stream. These symptoms have now past.  He brought a well-documented history of when he had bowel movements and when he had bleeding. Generally trending towards improvement.  He denied any fever or chills after the procedure. HPI  Past Medical History:  Diagnosis Date  . Atrial fibrillation ([place])   . Central hearing loss   . Colon polyp   . Dysrhythmia   . Hemorrhoid   . Hydrocele   . Skin cancer 2012, 2013, 2015,2017   basel cell  . Sleep apnea 2000   Uses Cpap machine    Past Surgical History:  Procedure Laterality Date  . CHOLECYSTECTOMY  2007  . CIRCUMCISION, NON-NEWBORN    . COLONOSCOPY  2012  . COLONOSCOPY WITH PROPOFOL N/A 03/22/2016   Procedure: COLONOSCOPY WITH PROPOFOL;  Surgeon: [name], MD;  Location: [facility]COPY;  Service: Endoscopy;  Laterality: N/A;  . [place]  . HYDROCELE EXCISION / REPAIR    . MOHS SURGERY      Family History  Problem Relation Age of Onset  . Cancer Father 87    colon    Social History Social History  Substance Use Topics  . Smoking status: Never Smoker  . Smokeless tobacco: Never Used  . Alcohol use No    No Known Allergies  Current Outpatient Prescriptions  Medication Sig Dispense Refill  . calcium citrate-vitamin D (CITRACAL+D) 315-200 MG-UNIT tablet Take 1 tablet by mouth daily.     . Cholecalciferol (VITAMIN D3) 2000 units capsule Take by mouth.    . diltiazem (DILACOR XR) 120 MG 24 hr capsule Take 120 mg by mouth daily.    . Omega-3 Fatty  Acids (FISH OIL PO) Take by mouth daily.     . rivaroxaban (XARELTO) 20 MG TABS tablet Take 20 mg by mouth daily with supper.    . vitamin C (ASCORBIC ACID) 500 MG tablet Take by mouth.     No current facility-administered medications for this visit.     Review of Systems Review of Systems  Constitutional: Negative.   Respiratory: Negative.   Cardiovascular: Negative.     Blood pressure 130/76, pulse 68, resp. rate 14, height 5\' 10"  (1.778 m), weight 265 lb (120.2 kg).  Physical Exam Physical Exam  Genitourinary: Prostate normal. Rectal exam shows no tenderness.       Data Reviewed Colonoscopy completed 03/22/2016 showed internal hemorrhoids. No other source for bleeding. Left anterior lateral and right posterior hemorrhoids were banded.  Assessment    Doing well status post hemorrhoid banding.  Urinary symptoms might a been secondary to local  irritation from the left anterior band, now resolved.    Plan    The patient will report if he notices further bleeding. Follow up otherwise will be on an as-needed basis. A repeat colonoscopy is not indicated unless new symptoms develop.     This information has been scribed by Gaspar Cola CMA.  [name] 04/05/2016,  7:22 PM

## 2016-10-18 ENCOUNTER — Encounter: Payer: Self-pay | Admitting: *Deleted

## 2016-10-19 NOTE — Discharge Instructions (Signed)

## 2016-10-24 ENCOUNTER — Encounter
Admission: RE | Disposition: A | Discharge status: Home / Self Care | Payer: Self-pay | Source: Ambulatory Visit | Attending: Ophthalmology

## 2016-10-24 ENCOUNTER — Ambulatory Visit: Payer: Medicare PPO | Admitting: Anesthesiology

## 2016-10-24 ENCOUNTER — Ambulatory Visit
Admission: RE | Admit: 2016-10-24 | Discharge: 2016-10-24 | Disposition: A | Discharge status: Home / Self Care | Payer: Medicare PPO | Source: Ambulatory Visit | Attending: Ophthalmology | Admitting: Ophthalmology

## 2016-10-24 DIAGNOSIS — I4891 Unspecified atrial fibrillation: Secondary | ICD-10-CM | POA: Diagnosis not present

## 2016-10-24 DIAGNOSIS — Z85828 Personal history of other malignant neoplasm of skin: Secondary | ICD-10-CM | POA: Insufficient documentation

## 2016-10-24 DIAGNOSIS — H2512 Age-related nuclear cataract, left eye: Secondary | ICD-10-CM | POA: Diagnosis not present

## 2016-10-24 DIAGNOSIS — G4730 Sleep apnea, unspecified: Secondary | ICD-10-CM | POA: Insufficient documentation

## 2016-10-24 DIAGNOSIS — M1990 Unspecified osteoarthritis, unspecified site: Secondary | ICD-10-CM | POA: Insufficient documentation

## 2016-10-24 DIAGNOSIS — H5703 Miosis: Secondary | ICD-10-CM | POA: Diagnosis not present

## 2016-10-24 HISTORY — DX: Unspecified osteoarthritis, unspecified site: M19.90

## 2016-10-24 HISTORY — DX: Presence of external hearing-aid: Z97.4

## 2016-10-24 HISTORY — PX: CATARACT EXTRACTION W/PHACO: SHX586

## 2016-10-24 SURGERY — PHACOEMULSIFICATION, CATARACT, WITH IOL INSERTION
Anesthesia: Monitor Anesthesia Care | Site: Eye | Laterality: Left | Wound class: Clean

## 2016-10-24 MED ORDER — LIDOCAINE HCL (PF) 4 % IJ SOLN
INTRAMUSCULAR | Status: DC | PRN
  Administered 2016-10-24: 1 mL via OPHTHALMIC

## 2016-10-24 MED ORDER — BRIMONIDINE TARTRATE-TIMOLOL 0.2-0.5 % OP SOLN
OPHTHALMIC | Status: DC | PRN
  Administered 2016-10-24: 1 [drp] via OPHTHALMIC

## 2016-10-24 MED ORDER — MIDAZOLAM HCL 2 MG/2ML IJ SOLN
INTRAMUSCULAR | Status: DC | PRN
  Administered 2016-10-24: 2 mg via INTRAVENOUS

## 2016-10-24 MED ORDER — EPINEPHRINE PF 1 MG/ML IJ SOLN
INTRAOCULAR | Status: DC | PRN
  Administered 2016-10-24: 62 mL via OPHTHALMIC

## 2016-10-24 MED ORDER — MOXIFLOXACIN HCL 0.5 % OP SOLN
1.0000 [drp] | OPHTHALMIC | Status: DC | PRN
  Administered 2016-10-24 (×3): 1 [drp] via OPHTHALMIC

## 2016-10-24 MED ORDER — ARMC OPHTHALMIC DILATING DROPS
1.0000 "application " | OPHTHALMIC | Status: DC | PRN
  Administered 2016-10-24 (×3): 1 via OPHTHALMIC

## 2016-10-24 MED ORDER — LACTATED RINGERS IV SOLN: INTRAVENOUS | Status: DC

## 2016-10-24 MED ORDER — NA HYALUR & NA CHOND-NA HYALUR 0.4-0.35 ML IO KIT
PACK | INTRAOCULAR | Status: DC | PRN
  Administered 2016-10-24: 1 mL via INTRAOCULAR

## 2016-10-24 MED ORDER — FENTANYL CITRATE (PF) 100 MCG/2ML IJ SOLN
INTRAMUSCULAR | Status: DC | PRN
  Administered 2016-10-24: 100 ug via INTRAVENOUS

## 2016-10-24 MED ORDER — CEFUROXIME OPHTHALMIC INJECTION 1 MG/0.1 ML
INJECTION | OPHTHALMIC | Status: DC | PRN
  Administered 2016-10-24: 0.1 mL via INTRACAMERAL

## 2016-10-24 SURGICAL SUPPLY — 25 items
CANNULA ANT/CHMB 27GA (MISCELLANEOUS) ×3 IMPLANT
CARTRIDGE ABBOTT (MISCELLANEOUS) IMPLANT
GLOVE SURG LX 7.5 STRW (GLOVE) ×4
GLOVE SURG LX STRL 7.5 STRW (GLOVE) ×2 IMPLANT
GLOVE SURG TRIUMPH 8.0 PF LTX (GLOVE) ×3 IMPLANT
GOWN STRL REUS W/ TWL LRG LVL3 (GOWN DISPOSABLE) ×2 IMPLANT
GOWN STRL REUS W/TWL LRG LVL3 (GOWN DISPOSABLE) ×4
LENS IOL TECNIS ITEC 18.5 (Intraocular Lens) ×3 IMPLANT
MARKER SKIN DUAL TIP RULER LAB (MISCELLANEOUS) ×3 IMPLANT
NDL RETROBULBAR .5 NSTRL (NEEDLE) IMPLANT
NEEDLE FILTER BLUNT 18X 1/2SAF (NEEDLE) ×2
NEEDLE FILTER BLUNT 18X1 1/2 (NEEDLE) ×1 IMPLANT
PACK CATARACT BRASINGTON (MISCELLANEOUS) ×3 IMPLANT
PACK EYE AFTER SURG (MISCELLANEOUS) ×3 IMPLANT
PACK OPTHALMIC (MISCELLANEOUS) ×3 IMPLANT
RING MALYGIN 7.0 (MISCELLANEOUS) IMPLANT
SUT ETHILON 10-0 CS-B-6CS-B-6 (SUTURE)
SUT VICRYL  9 0 (SUTURE)
SUT VICRYL 9 0 (SUTURE) IMPLANT
SUTURE EHLN 10-0 CS-B-6CS-B-6 (SUTURE) IMPLANT
SYR 3ML LL SCALE MARK (SYRINGE) ×3 IMPLANT
SYR 5ML LL (SYRINGE) ×3 IMPLANT
SYR TB 1ML LUER SLIP (SYRINGE) ×3 IMPLANT
WATER STERILE IRR 250ML POUR (IV SOLUTION) ×3 IMPLANT
WIPE NON LINTING 3.25X3.25 (MISCELLANEOUS) ×3 IMPLANT

## 2016-10-24 NOTE — H&P (Signed)
The History and Physical notes are on paper, have been signed, and are to be scanned. The patient remains stable and unchanged from the H&P.   Previous H&P reviewed, patient examined, and there are no changes.  Tyler Ritter 10/24/2016 8:20 AM

## 2016-10-24 NOTE — Anesthesia Preprocedure Evaluation (Signed)
Anesthesia Evaluation  Patient identified by MRN, date of birth, ID band Patient awake    Reviewed: Allergy & Precautions, H&P , NPO status , Patient's Chart, lab work & pertinent test results  Airway Mallampati: II  TM Distance: >3 FB Neck ROM: full    Dental no notable dental hx.    Pulmonary sleep apnea ,    Pulmonary exam normal        Cardiovascular + dysrhythmias Atrial Fibrillation  Rhythm:irregular     Neuro/Psych    GI/Hepatic   Endo/Other    Renal/GU      Musculoskeletal   Abdominal   Peds  Hematology   Anesthesia Other Findings   Reproductive/Obstetrics                             Anesthesia Physical Anesthesia Plan  ASA: II  Anesthesia Plan: MAC   Post-op Pain Management:    Induction:   PONV Risk Score and Plan:   Airway Management Planned:   Additional Equipment:   Intra-op Plan:   Post-operative Plan:   Informed Consent: I have reviewed the patients History and Physical, chart, labs and discussed the procedure including the risks, benefits and alternatives for the proposed anesthesia with the patient or authorized representative who has indicated his/her understanding and acceptance.     Plan Discussed with:   Anesthesia Plan Comments:         Anesthesia Quick Evaluation

## 2016-10-24 NOTE — Transfer of Care (Signed)
Immediate Anesthesia Transfer of Care Note  Patient: Tyler Ritter.  Procedure(s) Performed: Procedure(s) with comments: CATARACT EXTRACTION PHACO AND INTRAOCULAR LENS PLACEMENT (IOC)  Left (Left) - sleep apnea  Patient Location: PACU  Anesthesia Type: MAC  Level of Consciousness: awake, alert  and patient cooperative  Airway and Oxygen Therapy: Patient Spontanous Breathing and Patient connected to supplemental oxygen  Post-op Assessment: Post-op Vital signs reviewed, Patient's Cardiovascular Status Stable, Respiratory Function Stable, Patent Airway and No signs of Nausea or vomiting  Post-op Vital Signs: Reviewed and stable  Complications: No apparent anesthesia complications

## 2016-10-24 NOTE — Anesthesia Procedure Notes (Signed)
Procedure Name: MAC Performed by: Mayme Genta Pre-anesthesia Checklist: Patient identified, Emergency Drugs available, Suction available, Timeout performed and Patient being monitored Patient Re-evaluated:Patient Re-evaluated prior to inductionOxygen Delivery Method: Nasal cannula Placement Confirmation: positive ETCO2

## 2016-10-24 NOTE — Anesthesia Postprocedure Evaluation (Signed)
Anesthesia Post Note  Patient: Tyler Ritter.  Procedure(s) Performed: Procedure(s) (LRB): CATARACT EXTRACTION PHACO AND INTRAOCULAR LENS PLACEMENT (IOC)  Left (Left)  Patient location during evaluation: PACU Anesthesia Type: MAC Level of consciousness: awake and alert and oriented Pain management: satisfactory to patient Vital Signs Assessment: post-procedure vital signs reviewed and stable Respiratory status: spontaneous breathing, nonlabored ventilation and respiratory function stable Cardiovascular status: blood pressure returned to baseline and stable Postop Assessment: Adequate PO intake and No signs of nausea or vomiting Anesthetic complications: no    Raliegh Ip

## 2016-10-24 NOTE — Op Note (Signed)
OPERATIVE NOTE  Tyler Ritter [id number] 10/24/2016  PREOPERATIVE DIAGNOSIS:   Nuclear sclerotic cataract left eye with miotic pupil      H25.12   POSTOPERATIVE DIAGNOSIS:   Nuclear sclerotic cataract left eye with miotic pupil.     PROCEDURE:  Phacoemulsification with posterior chamber intraocular lens implantation of the left eye which required pupil stretching with the Malyugin pupil expansion device   LENS:   Implant Name Type Inv. Item Serial No. Manufacturer Lot No. LRB No. Used  LENS IOL DIOP 18.5 - [id number] Intraocular Lens LENS IOL DIOP 18.5 [id number] AMO   Left 1        ULTRASOUND TIME: 23 % of 1 minutes, 4 seconds.  CDE 15.0   SURGEON:  Wyonia Hough, MD   ANESTHESIA: Topical with tetracaine drops and 2% Xylocaine jelly, augmented with 1% preservative-free intracameral lidocaine.   COMPLICATIONS:  None.   DESCRIPTION OF PROCEDURE:  The patient was identified in the holding room and transported to the operating room and placed in the supine position under the operating microscope.  The left eye was identified as the operative eye and it was prepped and draped in the usual sterile ophthalmic fashion.   A 1 millimeter clear-corneal paracentesis was made at the 1:30 position.  The anterior chamber was filled with Viscoat viscoelastic.  0.5 ml of preservative-free 1% lidocaine was injected into the anterior chamber.  A 2.4 millimeter keratome was used to make a near-clear corneal incision at the 10:30 position.  A Malyugin pupil expander was then placed through the main incision and into the anterior chamber of the eye.  The edge of the iris was secured on the lip of the pupil expander and it was released, thereby expanding the pupil to approximately 8 millimeters for completion of the cataract surgery.  Additional Viscoat was placed in the anterior chamber.  A cystotome and capsulorrhexis forceps were used to make a curvilinear capsulorrhexis.   Balanced salt  solution was used to hydrodissect and hydrodelineate the lens nucleus.   Phacoemulsification was used in stop and chop fashion to remove the lens, nucleus and epinucleus.  The remaining cortex was aspirated using the irrigation aspiration handpiece.  Additional Provisc was placed into the eye to distend the capsular bag for lens placement.  A lens was then injected into the capsular bag.  The pupil expanding ring was removed using a Kuglen hook and insertion device. The remaining viscoelastic was aspirated from the capsular bag and the anterior chamber.  The anterior chamber was filled with balanced salt solution to inflate to a physiologic pressure.   Wounds were hydrated with balanced salt solution.  The anterior chamber was inflated to a physiologic pressure with balanced salt solution.  No wound leaks were noted. Cefuroxime 0.1 ml of a 10mg /ml solution was injected into the anterior chamber for a dose of 1 mg of intracameral antibiotic at the completion of the case.   Timolol and Brimonidine drops were applied to the eye.  The patient was taken to the recovery room in stable condition without complications of anesthesia or surgery.  [name] 10/24/2016, 9:33 AM

## 2016-10-25 ENCOUNTER — Encounter: Payer: Self-pay | Admitting: Ophthalmology

## 2017-01-24 ENCOUNTER — Encounter: Payer: Self-pay | Admitting: *Deleted

## 2017-01-30 NOTE — Discharge Instructions (Signed)

## 2017-02-01 ENCOUNTER — Ambulatory Visit: Payer: Medicare PPO | Admitting: Anesthesiology

## 2017-02-01 ENCOUNTER — Ambulatory Visit
Admission: RE | Admit: 2017-02-01 | Discharge: 2017-02-01 | Disposition: A | Discharge status: Home / Self Care | Payer: Medicare PPO | Source: Ambulatory Visit | Attending: Ophthalmology | Admitting: Ophthalmology

## 2017-02-01 ENCOUNTER — Encounter
Admission: RE | Disposition: A | Discharge status: Home / Self Care | Payer: Self-pay | Source: Ambulatory Visit | Attending: Ophthalmology

## 2017-02-01 DIAGNOSIS — G4730 Sleep apnea, unspecified: Secondary | ICD-10-CM | POA: Insufficient documentation

## 2017-02-01 DIAGNOSIS — H2511 Age-related nuclear cataract, right eye: Secondary | ICD-10-CM | POA: Insufficient documentation

## 2017-02-01 DIAGNOSIS — I4891 Unspecified atrial fibrillation: Secondary | ICD-10-CM | POA: Diagnosis not present

## 2017-02-01 DIAGNOSIS — M1990 Unspecified osteoarthritis, unspecified site: Secondary | ICD-10-CM | POA: Insufficient documentation

## 2017-02-01 DIAGNOSIS — Z85828 Personal history of other malignant neoplasm of skin: Secondary | ICD-10-CM | POA: Diagnosis not present

## 2017-02-01 DIAGNOSIS — H5703 Miosis: Secondary | ICD-10-CM | POA: Diagnosis not present

## 2017-02-01 DIAGNOSIS — Z9989 Dependence on other enabling machines and devices: Secondary | ICD-10-CM | POA: Insufficient documentation

## 2017-02-01 HISTORY — PX: CATARACT EXTRACTION W/PHACO: SHX586

## 2017-02-01 SURGERY — PHACOEMULSIFICATION, CATARACT, WITH IOL INSERTION
Anesthesia: Monitor Anesthesia Care | Laterality: Right | Wound class: Clean

## 2017-02-01 MED ORDER — BSS IO SOLN
INTRAOCULAR | Status: DC | PRN
  Administered 2017-02-01: 65 mL via OPHTHALMIC

## 2017-02-01 MED ORDER — MIDAZOLAM HCL 2 MG/2ML IJ SOLN
INTRAMUSCULAR | Status: DC | PRN
  Administered 2017-02-01: 2 mg via INTRAVENOUS

## 2017-02-01 MED ORDER — ARMC OPHTHALMIC DILATING DROPS
1.0000 "application " | Freq: Three times a day (TID) | OPHTHALMIC | Status: AC
  Administered 2017-02-01 (×3): 1 via OPHTHALMIC

## 2017-02-01 MED ORDER — CEFUROXIME OPHTHALMIC INJECTION 1 MG/0.1 ML
INJECTION | OPHTHALMIC | Status: DC | PRN
  Administered 2017-02-01: 0.1 mL via INTRACAMERAL

## 2017-02-01 MED ORDER — LIDOCAINE HCL (PF) 2 % IJ SOLN
INTRAMUSCULAR | Status: DC | PRN
  Administered 2017-02-01: 1 mL

## 2017-02-01 MED ORDER — NA HYALUR & NA CHOND-NA HYALUR 0.4-0.35 ML IO KIT
PACK | INTRAOCULAR | Status: DC | PRN
  Administered 2017-02-01: 1 mL via INTRAOCULAR

## 2017-02-01 MED ORDER — ARMC OPHTHALMIC DILATING DROPS: 1.0000 "application " | OPHTHALMIC | Status: DC | PRN

## 2017-02-01 MED ORDER — MOXIFLOXACIN HCL 0.5 % OP SOLN
1.0000 [drp] | Freq: Three times a day (TID) | OPHTHALMIC | Status: AC
  Administered 2017-02-01 (×3): 1 [drp] via OPHTHALMIC

## 2017-02-01 MED ORDER — FENTANYL CITRATE (PF) 100 MCG/2ML IJ SOLN
INTRAMUSCULAR | Status: DC | PRN
Start: 2017-02-01 — End: 2017-02-01
  Administered 2017-02-01: 50 ug via INTRAVENOUS

## 2017-02-01 MED ORDER — LACTATED RINGERS IV SOLN: 1000.0000 mL | INTRAVENOUS | Status: DC

## 2017-02-01 MED ORDER — BRIMONIDINE TARTRATE-TIMOLOL 0.2-0.5 % OP SOLN
OPHTHALMIC | Status: DC | PRN
  Administered 2017-02-01: 1 [drp] via OPHTHALMIC

## 2017-02-01 MED ORDER — MOXIFLOXACIN HCL 0.5 % OP SOLN: 1.0000 [drp] | OPHTHALMIC | Status: DC | PRN

## 2017-02-01 SURGICAL SUPPLY — 25 items
CANNULA ANT/CHMB 27GA (MISCELLANEOUS) ×3 IMPLANT
CARTRIDGE ABBOTT (MISCELLANEOUS) IMPLANT
GLOVE SURG LX 7.5 STRW (GLOVE) ×2
GLOVE SURG LX STRL 7.5 STRW (GLOVE) ×1 IMPLANT
GLOVE SURG TRIUMPH 8.0 PF LTX (GLOVE) ×3 IMPLANT
GOWN STRL REUS W/ TWL LRG LVL3 (GOWN DISPOSABLE) ×2 IMPLANT
GOWN STRL REUS W/TWL LRG LVL3 (GOWN DISPOSABLE) ×4
LENS IOL TECNIS ITEC 19.0 (Intraocular Lens) ×3 IMPLANT
MARKER SKIN DUAL TIP RULER LAB (MISCELLANEOUS) ×3 IMPLANT
NDL RETROBULBAR .5 NSTRL (NEEDLE) IMPLANT
NEEDLE FILTER BLUNT 18X 1/2SAF (NEEDLE) ×2
NEEDLE FILTER BLUNT 18X1 1/2 (NEEDLE) ×1 IMPLANT
PACK CATARACT BRASINGTON (MISCELLANEOUS) ×3 IMPLANT
PACK EYE AFTER SURG (MISCELLANEOUS) ×3 IMPLANT
PACK OPTHALMIC (MISCELLANEOUS) ×3 IMPLANT
RING MALYGIN 7.0 (MISCELLANEOUS) IMPLANT
SUT ETHILON 10-0 CS-B-6CS-B-6 (SUTURE)
SUT VICRYL  9 0 (SUTURE)
SUT VICRYL 9 0 (SUTURE) IMPLANT
SUTURE EHLN 10-0 CS-B-6CS-B-6 (SUTURE) IMPLANT
SYR 3ML LL SCALE MARK (SYRINGE) ×3 IMPLANT
SYR 5ML LL (SYRINGE) ×3 IMPLANT
SYR TB 1ML LUER SLIP (SYRINGE) ×3 IMPLANT
WATER STERILE IRR 250ML POUR (IV SOLUTION) ×3 IMPLANT
WIPE NON LINTING 3.25X3.25 (MISCELLANEOUS) ×3 IMPLANT

## 2017-02-01 NOTE — Anesthesia Postprocedure Evaluation (Signed)
Anesthesia Post Note  Patient: Tyler Ritter.  Procedure(s) Performed: CATARACT EXTRACTION PHACO AND INTRAOCULAR LENS PLACEMENT (IOC) COMPLICATED RIGHT (Right )  Patient location during evaluation: PACU Anesthesia Type: MAC Level of consciousness: awake and alert Pain management: pain level controlled Vital Signs Assessment: post-procedure vital signs reviewed and stable Respiratory status: spontaneous breathing, nonlabored ventilation, respiratory function stable and patient connected to nasal cannula oxygen Cardiovascular status: stable and blood pressure returned to baseline Postop Assessment: no apparent nausea or vomiting Anesthetic complications: no    DANIEL D KOVACS

## 2017-02-01 NOTE — H&P (Signed)
The History and Physical notes are on paper, have been signed, and are to be scanned. The patient remains stable and unchanged from the H&P.   Previous H&P reviewed, patient examined, and there are no changes.  Tyler Ritter 02/01/2017 8:02 AM

## 2017-02-01 NOTE — Anesthesia Preprocedure Evaluation (Addendum)
Anesthesia Evaluation  Patient identified by MRN, date of birth, ID band Patient awake    Reviewed: Allergy & Precautions, H&P , NPO status , Patient's Chart, lab work & pertinent test results, reviewed documented beta blocker date and time   Airway Mallampati: II  TM Distance: >3 FB Neck ROM: full    Dental no notable dental hx.    Pulmonary sleep apnea ,    Pulmonary exam normal breath sounds clear to auscultation       Cardiovascular Exercise Tolerance: Good + dysrhythmias Atrial Fibrillation  Rhythm:regular Rate:Normal     Neuro/Psych negative neurological ROS  negative psych ROS   GI/Hepatic negative GI ROS, Neg liver ROS,   Endo/Other  negative endocrine ROS  Renal/GU negative Renal ROS  negative genitourinary   Musculoskeletal   Abdominal   Peds  Hematology negative hematology ROS (+)   Anesthesia Other Findings   Reproductive/Obstetrics negative OB ROS                            Anesthesia Physical Anesthesia Plan  ASA: III  Anesthesia Plan: MAC   Post-op Pain Management:    Induction:   PONV Risk Score and Plan:   Airway Management Planned:   Additional Equipment:   Intra-op Plan:   Post-operative Plan:   Informed Consent: I have reviewed the patients History and Physical, chart, labs and discussed the procedure including the risks, benefits and alternatives for the proposed anesthesia with the patient or authorized representative who has indicated his/her understanding and acceptance.     Plan Discussed with: CRNA  Anesthesia Plan Comments:        Anesthesia Quick Evaluation

## 2017-02-01 NOTE — Transfer of Care (Signed)
Immediate Anesthesia Transfer of Care Note  Patient: Tyler Ritter.  Procedure(s) Performed: CATARACT EXTRACTION PHACO AND INTRAOCULAR LENS PLACEMENT (IOC) COMPLICATED RIGHT (Right )  Patient Location: PACU  Anesthesia Type: MAC  Level of Consciousness: awake, alert  and patient cooperative  Airway and Oxygen Therapy: Patient Spontanous Breathing and Patient connected to supplemental oxygen  Post-op Assessment: Post-op Vital signs reviewed, Patient's Cardiovascular Status Stable, Respiratory Function Stable, Patent Airway and No signs of Nausea or vomiting  Post-op Vital Signs: Reviewed and stable  Complications: No apparent anesthesia complications

## 2017-02-01 NOTE — Anesthesia Procedure Notes (Signed)
Procedure Name: MAC Performed by: Mayme Genta Pre-anesthesia Checklist: Patient identified, Emergency Drugs available, Suction available, Timeout performed and Patient being monitored Patient Re-evaluated:Patient Re-evaluated prior to induction Oxygen Delivery Method: Nasal cannula Placement Confirmation: positive ETCO2

## 2017-02-01 NOTE — Op Note (Addendum)
OPERATIVE NOTE  Tyler Ritter [id number] 02/01/2017   PREOPERATIVE DIAGNOSIS:    Nuclear Sclerotic Cataract Right eye with miotic pupil.        H25.11  POSTOPERATIVE DIAGNOSIS: Nuclear Sclerotic Cataract Right eye with miotic pupil.          PROCEDURE:  Phacoemusification with posterior chamber intraocular lens placement of the right eye which required pupil stretching with the Malyugin pupil expansion device.  LENS:   Implant Name Type Inv. Item Serial No. Manufacturer Lot No. LRB No. Used  LENS IOL DIOP 19.0 - [id number] Intraocular Lens LENS IOL DIOP 19.0 [id number] AMO   Right 1       ULTRASOUND TIME: 16 % of 0 minutes 55 seconds, CDE 9.1  SURGEON:  Wyonia Hough, MD   ANESTHESIA:  Topical with tetracaine drops and 2% Xylocaine jelly, augmented with 1% preservative-free intracameral lidocaine.   COMPLICATIONS:  None.   DESCRIPTION OF PROCEDURE:  The patient was identified in the holding room and transported to the operating room and placed in the supine position under the operating microscope. Theright eye was identified as the operative eye and it was prepped and draped in the usual sterile ophthalmic fashion.   A 1 millimeter clear-corneal paracentesis was made at the 12:00 position.  0.5 ml of preservative-free 1% lidocaine was injected into the anterior chamber. The anterior chamber was filled with Viscoat viscoelastic.  A 2.4 millimeter keratome was used to make a near-clear corneal incision at the 9:00 position. A Malyugin pupil expander was then placed through the main incision and into the anterior chamber of the eye.  The edge of the iris was secured on the lip of the pupil expander and it was released, thereby expanding the pupil to approximately 8 millimeters for completion of the cataract surgery.  Additional Viscoat was placed in the anterior chamber.  A cystotome and capsulorrhexis forceps were used to make a curvilinear capsulorrhexis.   Balanced salt  solution was used to hydrodissect and hydrodelineate the lens nucleus.   Phacoemulsification was used in stop and chop fashion to remove the lens, nucleus and epinucleus.  The remaining cortex was aspirated using the irrigation aspiration handpiece.  Additional Provisc was placed into the eye to distend the capsular bag for lens placement.  A lens was then injected into the capsular bag.  The pupil expanding ring was removed using a Kuglen hook and insertion device. The remaining viscoelastic was aspirated from the capsular bag and the anterior chamber.  The anterior chamber was filled with balanced salt solution to inflate to a physiologic pressure.  Wounds were hydrated with balanced salt solution.  The anterior chamber was inflated to a physiologic pressure with balanced salt solution.  No wound leaks were noted.Cefuroxime 0.1 ml of a 10mg /ml solution was injected into the anterior chamber for a dose of 1 mg of intracameral antibiotic at the completion of the case. Timolol and Brimonidine drops were applied to the eye.  The patient was taken to the recovery room in stable condition without complications of anesthesia or surgery.  Adeeb Konecny 02/01/2017, 9:35 AM

## 2017-02-02 ENCOUNTER — Encounter: Payer: Self-pay | Admitting: Ophthalmology

## 2017-02-17 IMAGING — CR DG TIBIA/FIBULA 2V*R*
1 series · 4 of 4 positions shown · non-contrast
Comparison: None.

CLINICAL DATA: Acute right lower leg pain following fall today.
Initial encounter.

EXAM:
RIGHT TIBIA AND FIBULA - 2 VIEW

[Series 1: dg tibia/fibula right · 0.14mm/px · 4 of 4 slices shown]
[im 1/4]
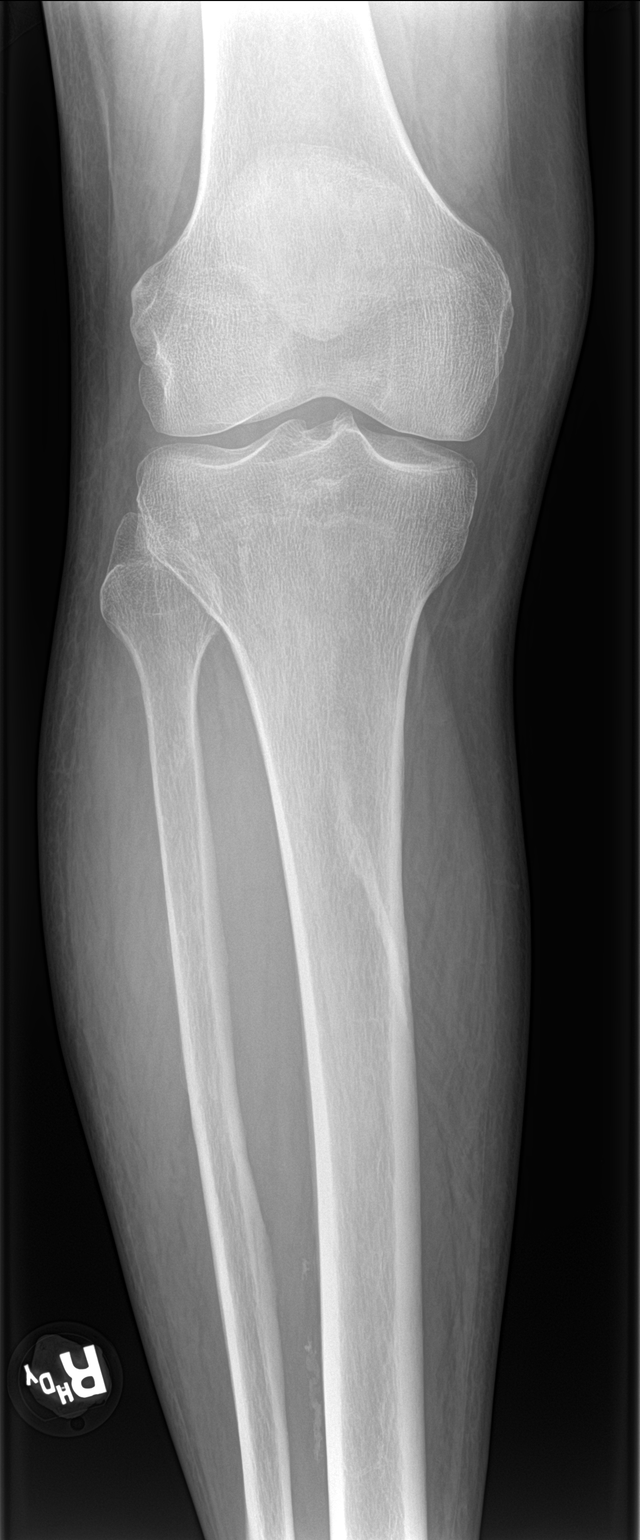
[im 2/4]
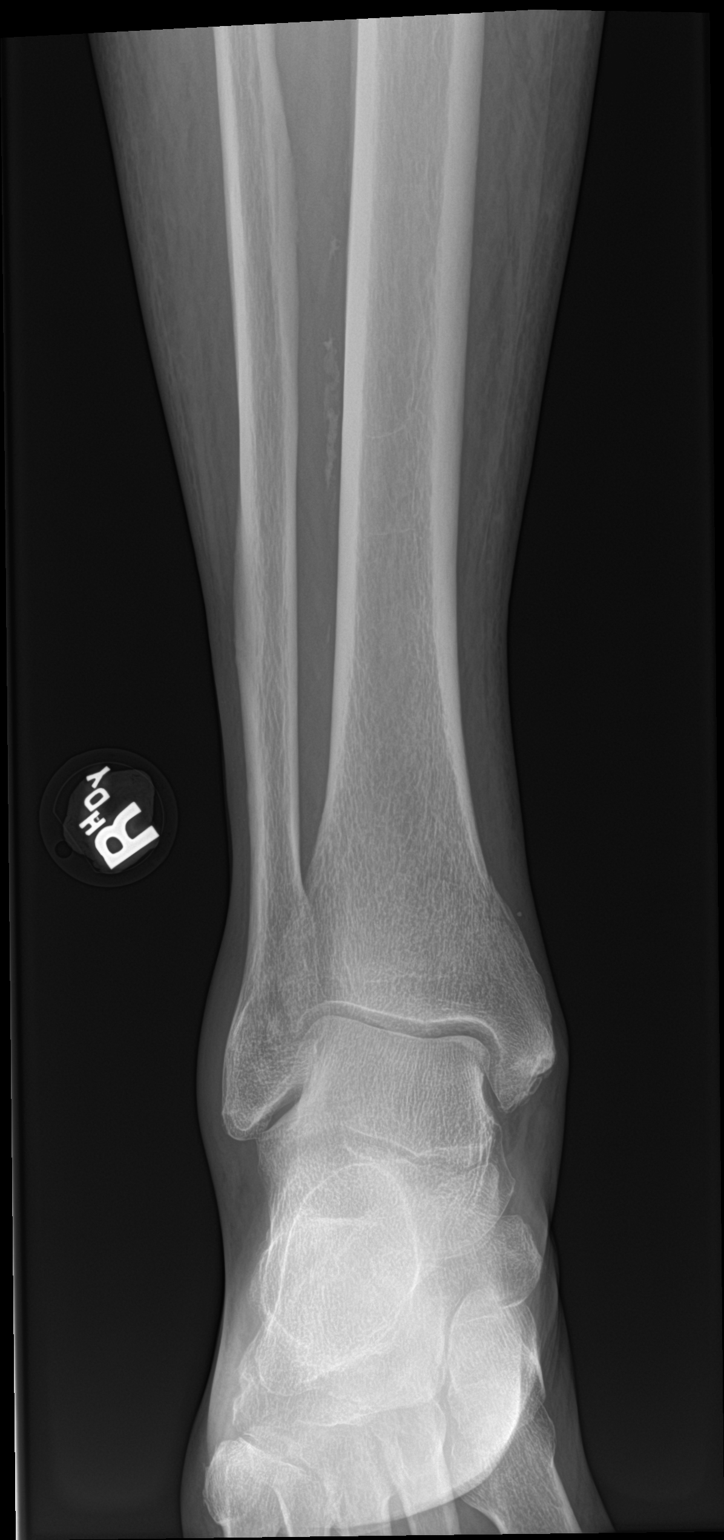
[im 3/4]
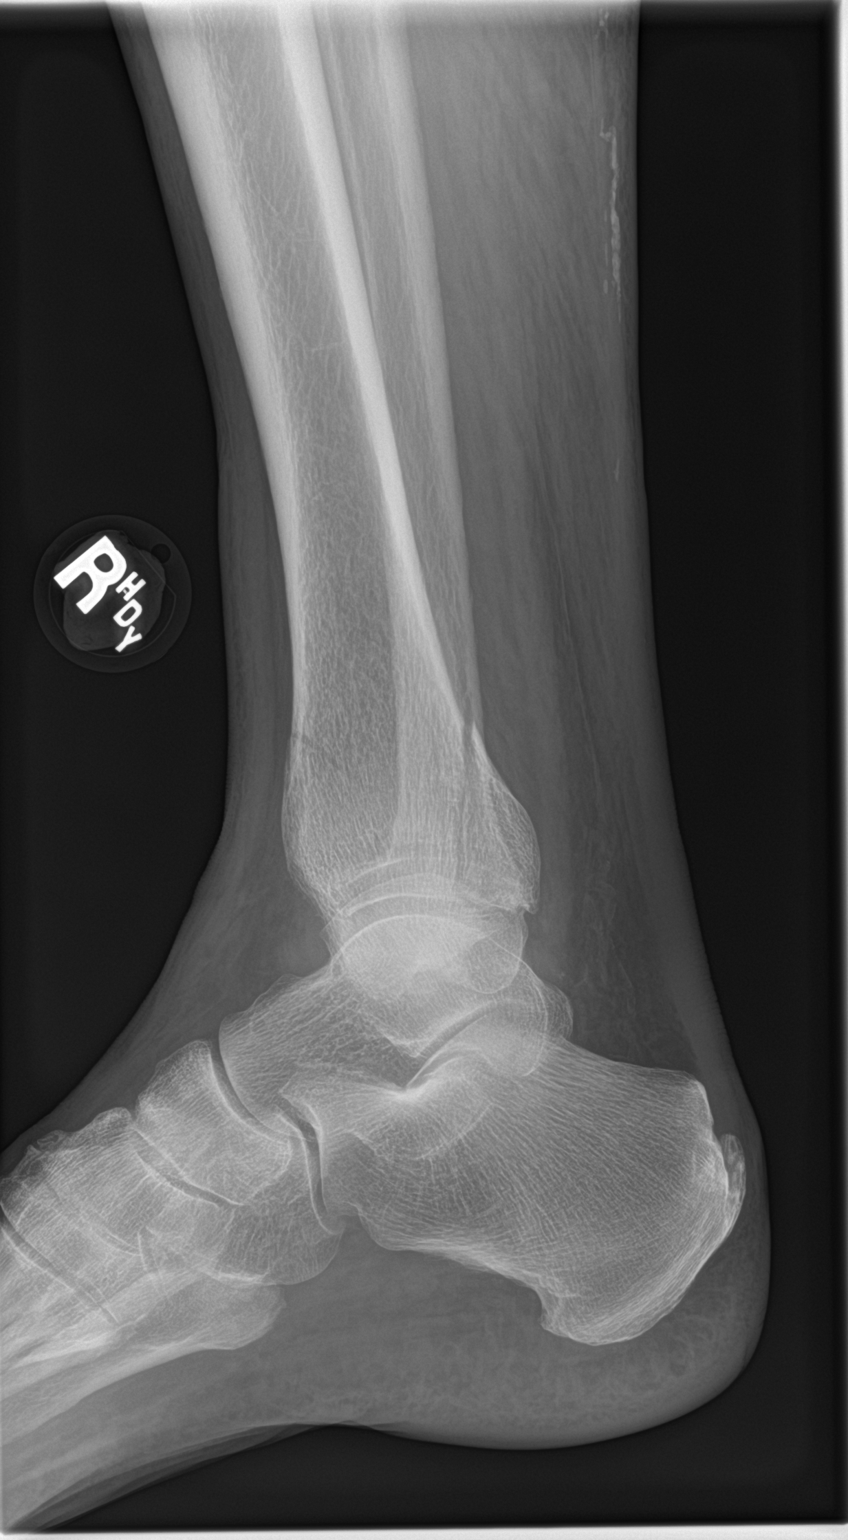
[im 4/4]
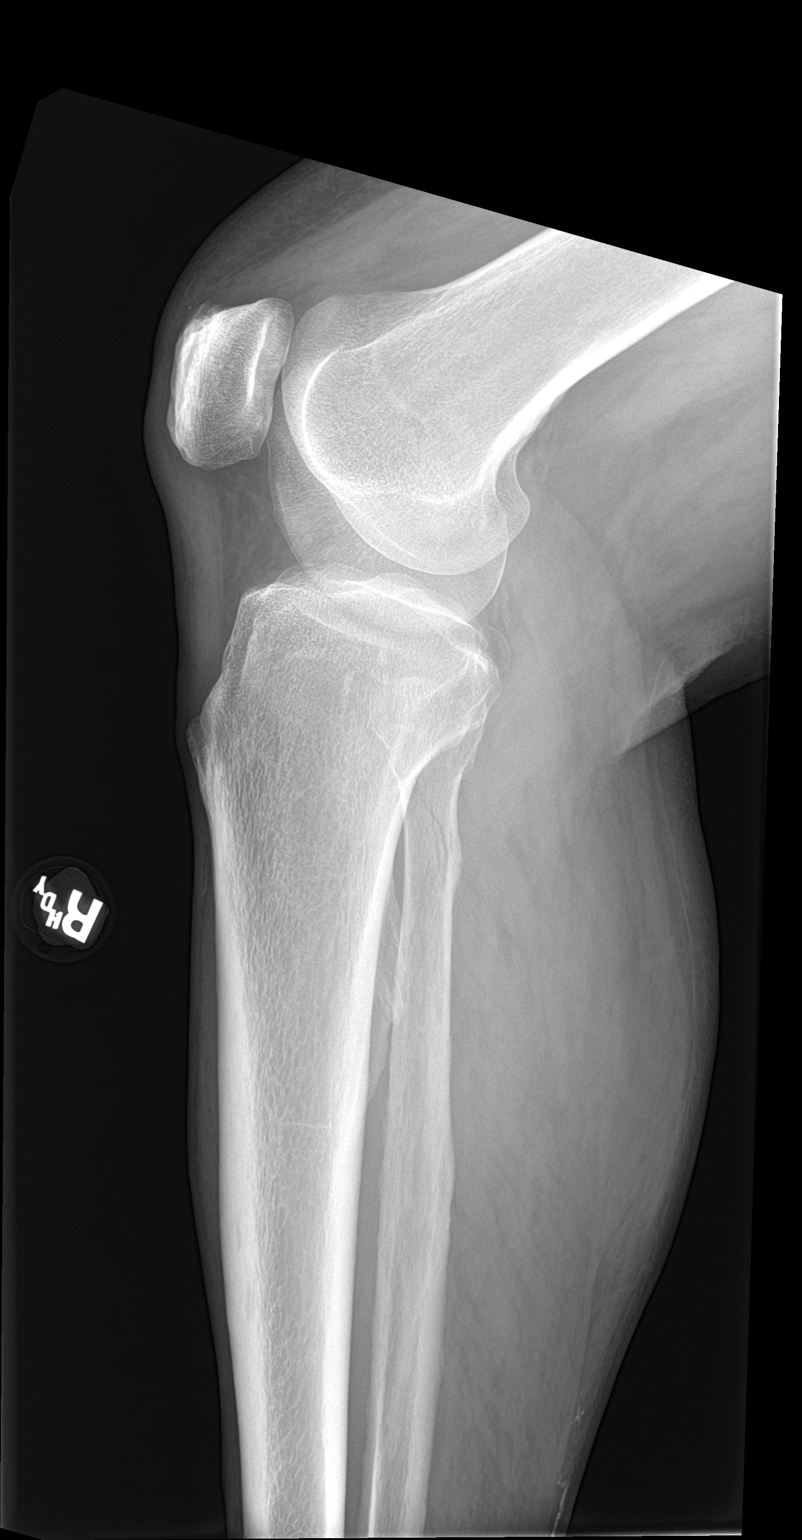

[4 of 4 positions shown; findings below may reference images not displayed]

FINDINGS: Nondisplaced fractures of the proximal fibular diaphysis, posterior
distal malleolus (intraarticular) and anterior medial distal tibia
noted.

There is no evidence of subluxation or dislocation.

Mild soft tissue swelling noted.

No suspicious focal bony lesions are identified.
IMPRESSION: Nondisplaced fractures of the proximal fibula, and posterior and
anterior/medial distal tibia.

## 2017-02-18 IMAGING — CT CT HEAD W/O CM
3 series · 15 of 47 positions shown, 18 images · non-contrast
Comparison: None.

CLINICAL DATA: Fell down steps today and hit head.

EXAM:
CT HEAD WITHOUT CONTRAST
TECHNIQUE: Contiguous axial images were obtained from the base of the skull
through the vertex without intravenous contrast.

[Series 2: head wo · axial · 0.45mm/px · z∈[-173,-48]mm · 9 of 31 slices shown, 12 images]
[im 3/31  brain]
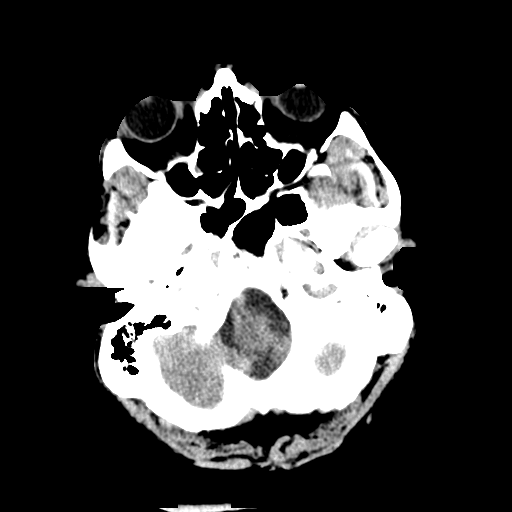
[im 3/31  bone]
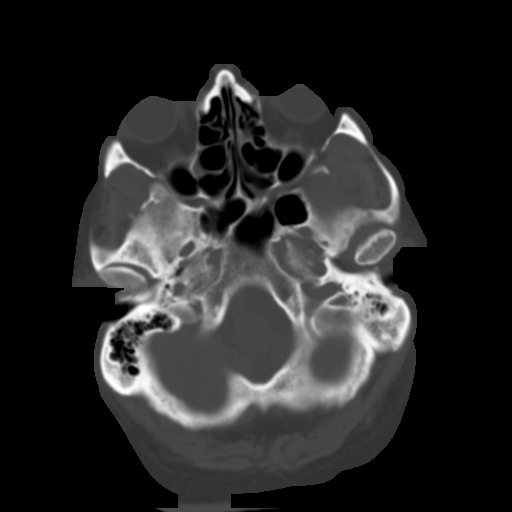
[im 6/31  brain]
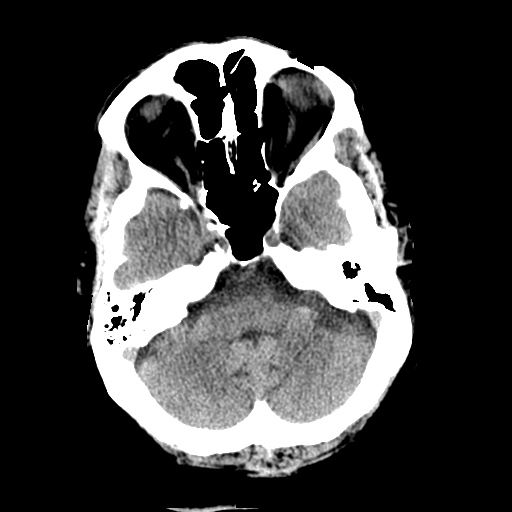
[im 9/31  brain]
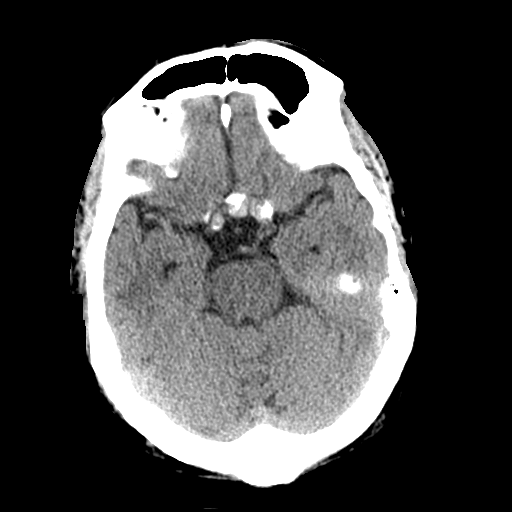
[im 12/31  brain]
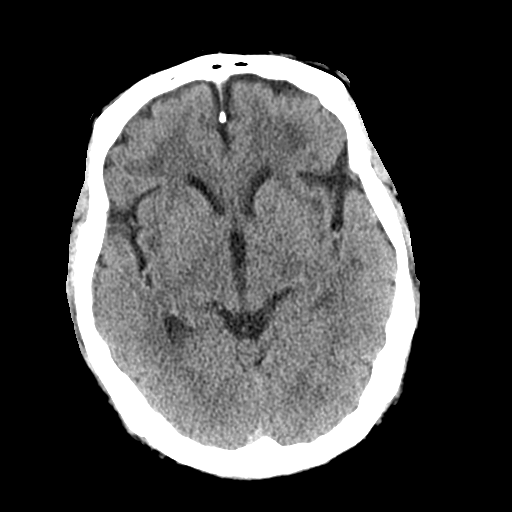
[im 16/31  brain]
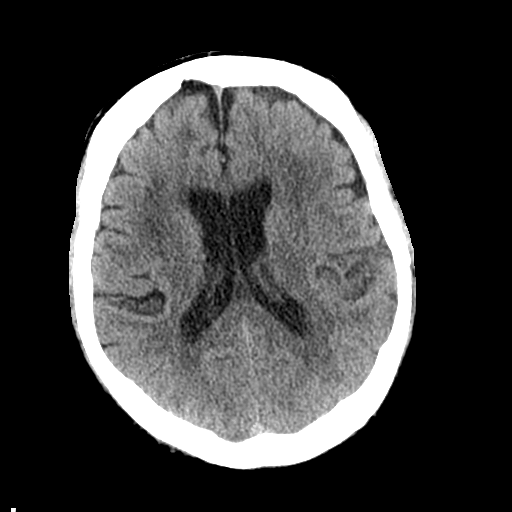
[im 16/31  bone]
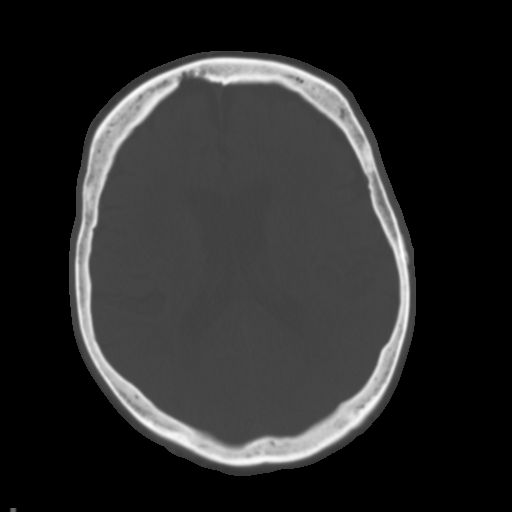
[im 19/31  brain]
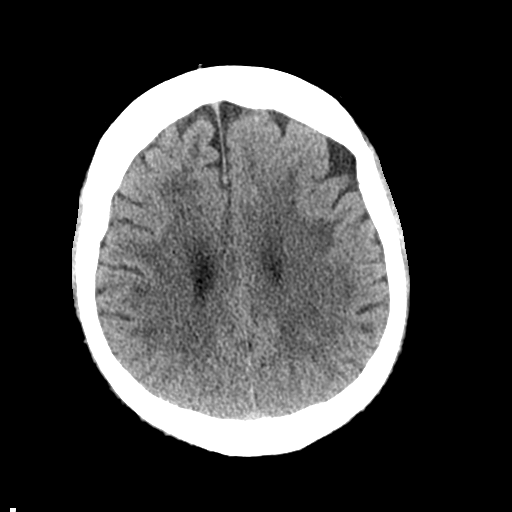
[im 22/31  brain]
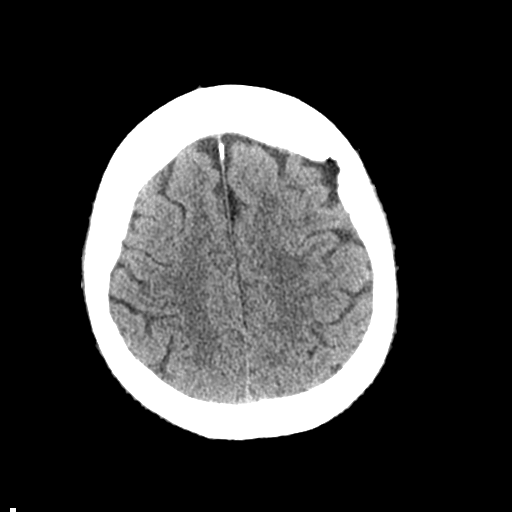
[im 25/31  brain]
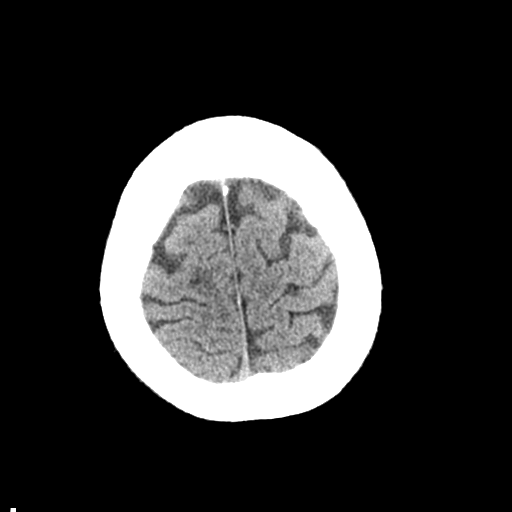
[im 28/31  brain]
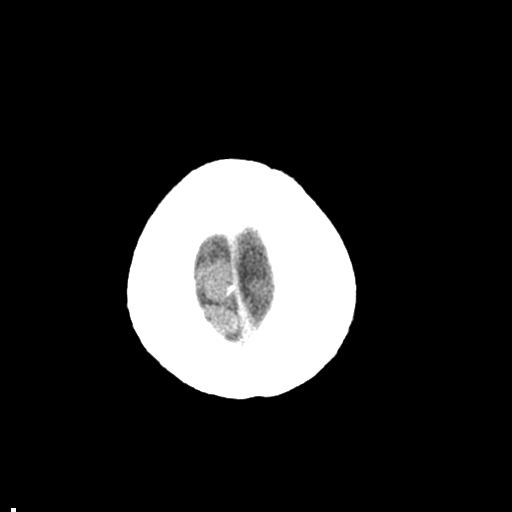
[im 28/31  bone]
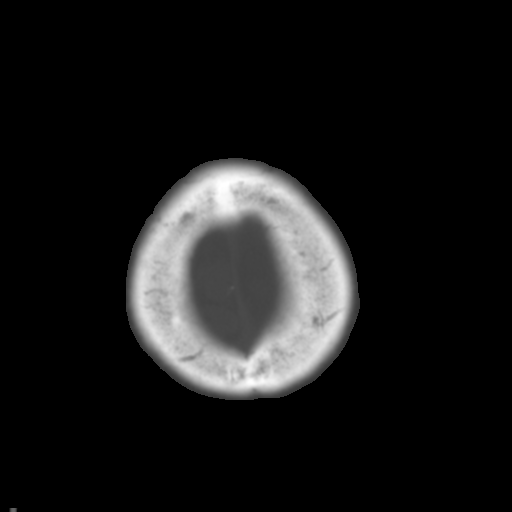

[Series 4: coronal soft tissue · coronal · 0.31mm/px · 3 of 72 slices shown]
[im 24/72  brain]
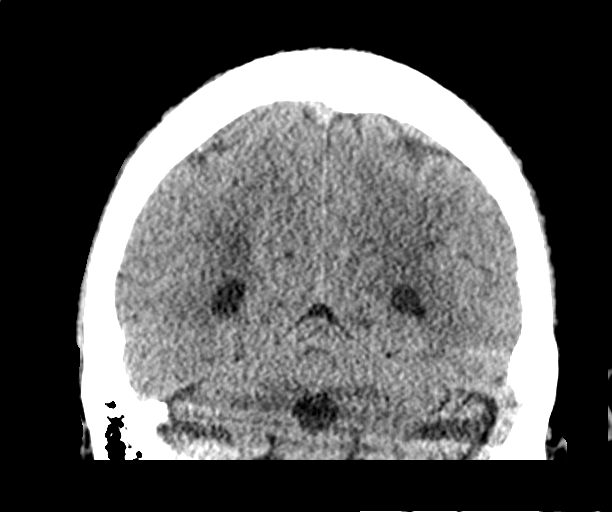
[im 32/72  brain]
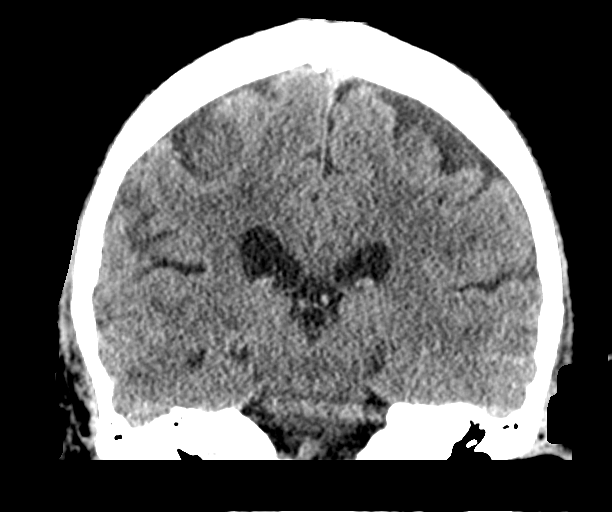
[im 40/72  brain]
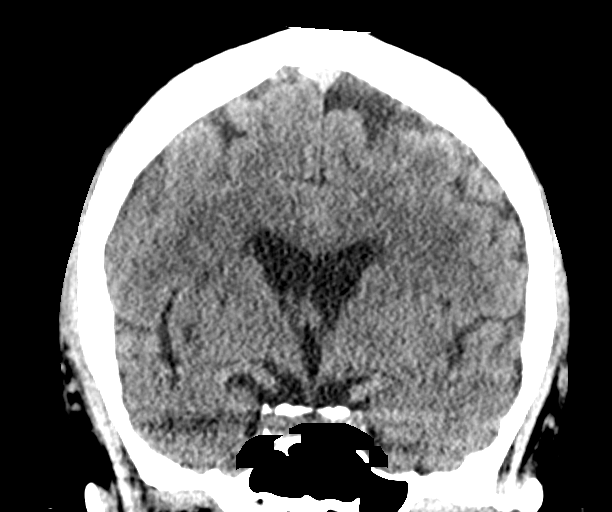

[Series 5: sagittal soft tissue · sagittal · 0.31mm/px · 3 of 57 slices shown]
[im 19/57  brain]
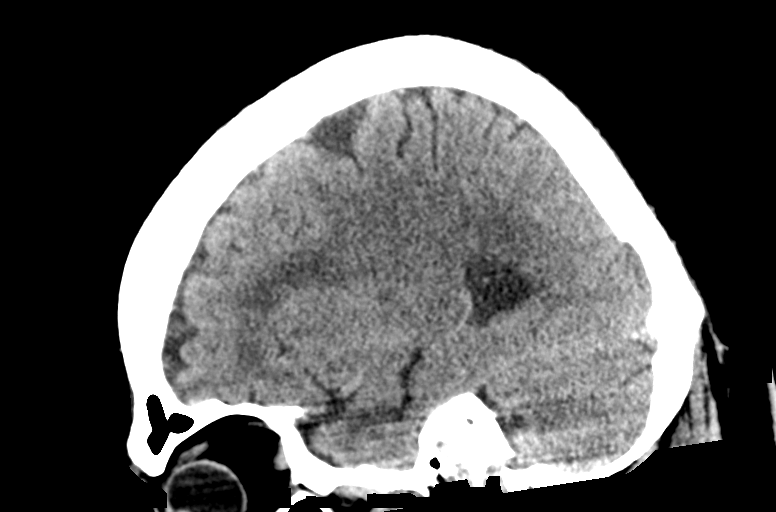
[im 29/57  brain]
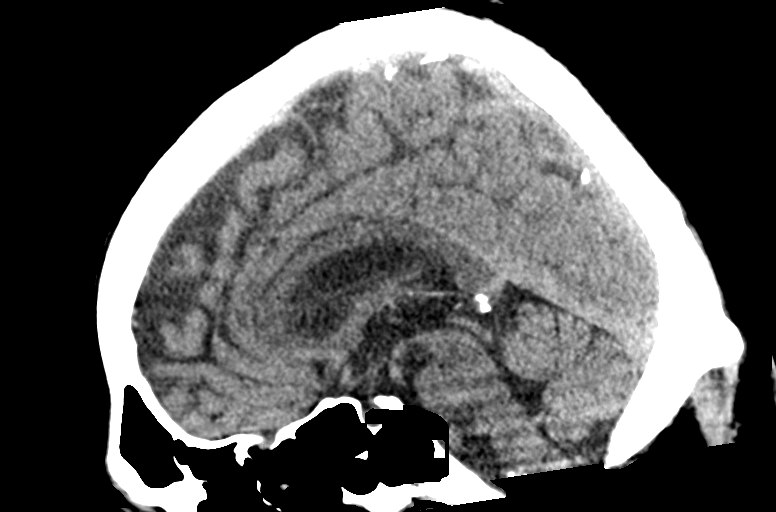
[im 38/57  brain]
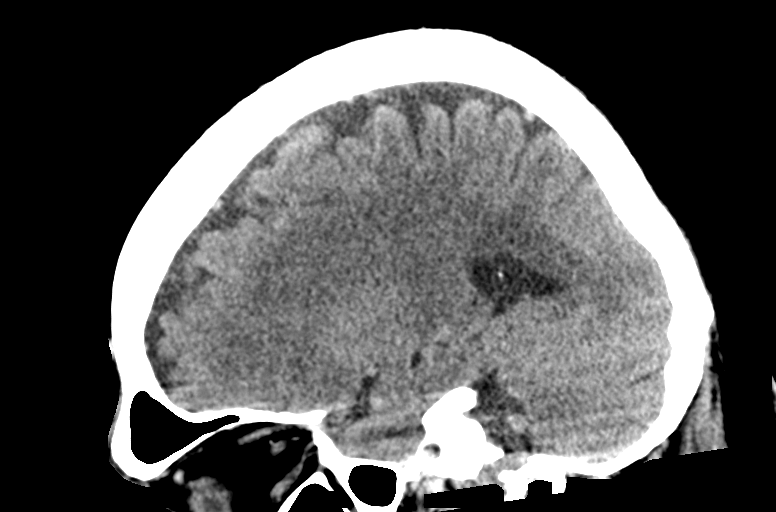

[15 of 47 positions shown; findings below may reference images not displayed]

FINDINGS: Brain: Age related cerebral atrophy, ventriculomegaly and slightly
advanced periventricular white matter disease. No extra-axial fluid
collections are identified. No CT findings for acute hemispheric
infarction or intracranial hemorrhage. No mass lesions. The
brainstem and cerebellum are normal.

Vascular: Vascular calcifications but no definite aneurysm.

Skull: No skull fracture or bone lesion.

Sinuses/Orbits: The paranasal sinuses and mastoid air cells are
clear. The globes are intact.

Other: None
IMPRESSION: 1. Age related cerebral atrophy, ventriculomegaly and slightly
advanced periventricular white matter disease.
2. No acute intracranial findings or mass lesions.
3. No skull fracture.

## 2021-01-26 ENCOUNTER — Encounter: Payer: Self-pay | Admitting: General Surgery

## 2021-01-27 ENCOUNTER — Encounter: Payer: Self-pay | Admitting: General Surgery

## 2021-01-27 ENCOUNTER — Encounter
Admission: RE | Disposition: A | Discharge status: Home / Self Care | Payer: Self-pay | Source: Home / Self Care | Attending: General Surgery

## 2021-01-27 ENCOUNTER — Ambulatory Visit: Payer: Medicare HMO | Admitting: Registered Nurse

## 2021-01-27 ENCOUNTER — Ambulatory Visit
Admission: RE | Admit: 2021-01-27 | Discharge: 2021-01-27 | Disposition: A | Discharge status: Home / Self Care | Payer: Medicare HMO | Attending: General Surgery | Admitting: General Surgery

## 2021-01-27 DIAGNOSIS — D175 Benign lipomatous neoplasm of intra-abdominal organs: Secondary | ICD-10-CM | POA: Diagnosis not present

## 2021-01-27 DIAGNOSIS — G4730 Sleep apnea, unspecified: Secondary | ICD-10-CM | POA: Diagnosis not present

## 2021-01-27 DIAGNOSIS — I4891 Unspecified atrial fibrillation: Secondary | ICD-10-CM | POA: Insufficient documentation

## 2021-01-27 DIAGNOSIS — Z85828 Personal history of other malignant neoplasm of skin: Secondary | ICD-10-CM | POA: Diagnosis not present

## 2021-01-27 DIAGNOSIS — Z1211 Encounter for screening for malignant neoplasm of colon: Secondary | ICD-10-CM | POA: Insufficient documentation

## 2021-01-27 DIAGNOSIS — Z7901 Long term (current) use of anticoagulants: Secondary | ICD-10-CM | POA: Insufficient documentation

## 2021-01-27 DIAGNOSIS — Z79899 Other long term (current) drug therapy: Secondary | ICD-10-CM | POA: Insufficient documentation

## 2021-01-27 DIAGNOSIS — D123 Benign neoplasm of transverse colon: Secondary | ICD-10-CM | POA: Insufficient documentation

## 2021-01-27 DIAGNOSIS — Z800 Family history of malignant neoplasm of digestive organs: Secondary | ICD-10-CM | POA: Insufficient documentation

## 2021-01-27 DIAGNOSIS — K641 Second degree hemorrhoids: Secondary | ICD-10-CM | POA: Diagnosis not present

## 2021-01-27 DIAGNOSIS — Z86010 Personal history of colonic polyps: Secondary | ICD-10-CM | POA: Diagnosis not present

## 2021-01-27 DIAGNOSIS — I10 Essential (primary) hypertension: Secondary | ICD-10-CM | POA: Insufficient documentation

## 2021-01-27 DIAGNOSIS — K5730 Diverticulosis of large intestine without perforation or abscess without bleeding: Secondary | ICD-10-CM | POA: Insufficient documentation

## 2021-01-27 HISTORY — DX: Essential (primary) hypertension: I10

## 2021-01-27 HISTORY — PX: COLONOSCOPY WITH PROPOFOL: SHX5780

## 2021-01-27 SURGERY — COLONOSCOPY WITH PROPOFOL
Anesthesia: General

## 2021-01-27 MED ORDER — EPHEDRINE 5 MG/ML INJ
INTRAVENOUS | Status: AC
  Filled 2021-01-27: qty 5

## 2021-01-27 MED ORDER — RIVAROXABAN 20 MG PO TABS: 20.0000 mg | ORAL_TABLET | Freq: Every day | ORAL | 1 refills | Status: DC

## 2021-01-27 MED ORDER — GLYCOPYRROLATE 0.2 MG/ML IJ SOLN
INTRAMUSCULAR | Status: AC
  Filled 2021-01-27: qty 1

## 2021-01-27 MED ORDER — PROPOFOL 10 MG/ML IV BOLUS
INTRAVENOUS | Status: AC
  Filled 2021-01-27: qty 20

## 2021-01-27 MED ORDER — LIDOCAINE HCL (CARDIAC) PF 100 MG/5ML IV SOSY
PREFILLED_SYRINGE | INTRAVENOUS | Status: DC | PRN
  Administered 2021-01-27: 100 mg via INTRAVENOUS

## 2021-01-27 MED ORDER — PROPOFOL 10 MG/ML IV BOLUS
INTRAVENOUS | Status: DC | PRN
  Administered 2021-01-27: 80 mg via INTRAVENOUS

## 2021-01-27 MED ORDER — PROPOFOL 500 MG/50ML IV EMUL
INTRAVENOUS | Status: DC | PRN
  Administered 2021-01-27: 150 ug/kg/min via INTRAVENOUS

## 2021-01-27 MED ORDER — GLYCOPYRROLATE 0.2 MG/ML IJ SOLN
INTRAMUSCULAR | Status: DC | PRN
  Administered 2021-01-27: .2 mg via INTRAVENOUS

## 2021-01-27 MED ORDER — SODIUM CHLORIDE 0.9 % IV SOLN: INTRAVENOUS | Status: DC

## 2021-01-27 NOTE — H&P (Signed)
Tyler Ritter [id number] April 17, 1941     HPI:  Healthy 80 y/o male with history of father with colon cancer at age 2.  For screening exam.  Tolerated prep well.  Has held Xarelto since 01/24/21 as requested.   Medications Prior to Admission  Medication Sig Dispense Refill Last Dose   Alpha-Lipoic Acid 100 MG CAPS Take by mouth.      azelastine (ASTELIN) 0.1 % nasal spray Place 2 sprays into both nostrils 2 (two) times daily. Use in each nostril as directed      calcium citrate-vitamin D (CITRACAL+D) 315-200 MG-UNIT tablet Take 1 tablet by mouth daily.    Past Week   Cholecalciferol (VITAMIN D3) 2000 units capsule Take by mouth.   Past Week   diltiazem (DILACOR XR) 120 MG 24 hr capsule Take 120 mg by mouth daily.   01/26/2021   fluticasone (FLONASE) 50 MCG/ACT nasal spray Place into both nostrils as needed for allergies or rhinitis.   Past Month   losartan-hydrochlorothiazide (HYZAAR) 100-12.5 MG tablet Take 1 tablet by mouth daily.   01/26/2021   Magnesium Chloride (MAG-DELAY PO) Take 535 mg by mouth daily.   Past Week   Multiple Vitamin (MULTIVITAMIN) tablet Take 1 tablet by mouth daily.   Past Week   acetaminophen (TYLENOL) 500 MG tablet Take 500 mg by mouth every 6 (six) hours as needed.      Omega-3 Fatty Acids (FISH OIL PO) Take by mouth daily.       rivaroxaban (XARELTO) 20 MG TABS tablet Take 20 mg by mouth daily with supper.   01/24/2021   vitamin C (ASCORBIC ACID) 500 MG tablet Take by mouth.      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   Sleep apnea 2000   Uses Cpap machine   Wears hearing aid in both ears    Past Surgical History:  Procedure Laterality Date   CATARACT EXTRACTION W/PHACO Left 10/24/2016   Procedure: CATARACT EXTRACTION PHACO  AND INTRAOCULAR LENS PLACEMENT ([place])  Left;  Surgeon: Leandrew Koyanagi, MD;  Location: [place];  Service: Ophthalmology;  Laterality: Left;  sleep apnea   CATARACT EXTRACTION W/PHACO Right 02/01/2017   Procedure: CATARACT EXTRACTION PHACO AND INTRAOCULAR LENS PLACEMENT ([place]) COMPLICATED RIGHT;  Surgeon: Leandrew Koyanagi, MD;  Location: [place];  Service: Ophthalmology;  Laterality: Right;  sleep apnea requests early   CHOLECYSTECTOMY  2007   CIRCUMCISION, NON-NEWBORN     COLONOSCOPY  2012   COLONOSCOPY WITH PROPOFOL N/A 03/22/2016   Procedure: COLONOSCOPY WITH PROPOFOL;  Surgeon: [name], MD;  Location: [facility] ENDOSCOPY;  Service: Endoscopy;  Laterality: N/A;   HERNIA REPAIR  1952   HYDROCELE EXCISION / REPAIR     MOHS SURGERY     TONSILLECTOMY     Social History   Socioeconomic History   Marital status: Married    Spouse name: Not on file   Number of children: Not on file   Years of education: Not on file   Highest education level: Not on file  Occupational History   Not on file  Tobacco Use   Smoking status: Never   Smokeless tobacco: Never  Substance and Sexual Activity   Alcohol use: No    Alcohol/week: 0.0 standard drinks   Drug use: No   Sexual activity: Not on file  Other Topics Concern   Not on file  Social History Narrative   Not on file   Social Determinants of Health   Financial Resource Strain: Not on file  Food Insecurity: Not on file  Transportation Needs: Not on file  Physical Activity: Not on file  Stress: Not on file  Social Connections: Not on file  Intimate Partner Violence: Not on file   Social History   Social History Narrative   Not on file     ROS: Negative.     PE: HEENT: Negative. Lungs: Clear. Cardio: RR.    Assessment/Plan:  Proceed with planned endoscopy.  Forest Gleason Kerman Pfost 01/27/2021

## 2021-01-27 NOTE — Op Note (Signed)
[facility]astroenterology Patient Name: Tyler Ritter Procedure Date: 01/27/2021 8:53 AM MRN: [id number] Account #: [id number] Date of Birth: Feb 08, 1941 Admit Type: Outpatient Age: 80 Room: [facility] ENDO ROOM 1 Gender: Male Note Status: Finalized Instrument Name: Peds Colonoscope [id number] Procedure:             Colonoscopy Indications:           Family history of colon cancer in a first-degree                         relative before age 38 years Providers:             [name], MD Referring MD:          Ocie Cornfield. Ouida Sills MD, MD (Referring MD) Medicines:             Propofol per Anesthesia Complications:         No immediate complications. Procedure:             Pre-Anesthesia Assessment:                        - Prior to the procedure, a History and Physical was                         performed, and patient medications, allergies and                         sensitivities were reviewed. The patient's tolerance                         of previous anesthesia was reviewed.                        - The risks and benefits of the procedure and the                         sedation options and risks were discussed with the                         patient. All questions were answered and informed                         consent was obtained.                        After obtaining informed consent, the colonoscope was                         passed under direct vision. Throughout the procedure,                         the patient's blood pressure, pulse, and oxygen                         saturations were monitored continuously. The                         Colonoscope was introduced through the anus and  advanced to the the terminal ileum. The colonoscopy                         was performed without difficulty. The patient                         tolerated the procedure well. The quality of the bowel                         preparation  was excellent. Findings:      A few medium-mouthed diverticula were found in the sigmoid colon and       hepatic flexure.      There was a medium-sized lipoma, 20 mm in diameter, in the ascending       colon.      A 6 mm polyp was found in the hepatic flexure. The polyp was sessile.       The polyp was removed with a hot snare. Resection and retrieval were       complete.      Internal hemorrhoids were found during retroflexion. The hemorrhoids       were Grade II (internal hemorrhoids that prolapse but reduce       spontaneously). Impression:            - Diverticulosis in the sigmoid colon and at the                         hepatic flexure.                        - Medium-sized lipoma in the ascending colon.                        - One 6 mm polyp at the hepatic flexure, removed with                         a hot snare. Resected and retrieved.                        - Internal hemorrhoids. Recommendation:        - Telephone endoscopist for pathology results in 1                         week. Procedure Code(s):     --- Professional ---                        [phone], Colonoscopy, flexible; with removal of                         tumor(s), polyp(s), or other lesion(s) by snare                         technique Diagnosis Code(s):     --- Professional ---                        K64.1, Second degree hemorrhoids                        D17.5, Benign lipomatous neoplasm of intra-abdominal  organs                        K63.5, Polyp of colon                        Z80.0, Family history of malignant neoplasm of                         digestive organs                        K57.30, Diverticulosis of large intestine without                         perforation or abscess without bleeding CPT copyright 2019 American Medical Association. All rights reserved. The codes documented in this report are preliminary and upon coder review may  be revised to meet current compliance  requirements. [name], MD 01/27/2021 9:36:10 AM This report has been signed electronically. Number of Addenda: 0 Note Initiated On: 01/27/2021 8:53 AM Scope Withdrawal Time: 0 hours 9 minutes 28 seconds  Total Procedure Duration: 0 hours 16 minutes 36 seconds  Estimated Blood Loss:  Estimated blood loss: none.      [facility]

## 2021-01-27 NOTE — Transfer of Care (Signed)
Immediate Anesthesia Transfer of Care Note  Patient: Tyler Ritter.  Procedure(s) Performed: COLONOSCOPY WITH PROPOFOL  Patient Location: Endoscopy Unit  Anesthesia Type:General  Level of Consciousness: drowsy  Airway & Oxygen Therapy: Patient Spontanous Breathing  Post-op Assessment: Report given to RN and Post -op Vital signs reviewed and stable  Post vital signs: Reviewed and stable  Last Vitals:  Vitals Value Taken Time  BP 128/74 01/27/21 0937  Temp    Pulse 54 01/27/21 0938  Resp 10 01/27/21 0938  SpO2 97 % 01/27/21 0938  Vitals shown include unvalidated device data.  Last Pain:  Vitals:   01/27/21 0843  TempSrc: Temporal         Complications: No notable events documented.

## 2021-01-27 NOTE — Anesthesia Preprocedure Evaluation (Signed)
Anesthesia Evaluation  Patient identified by MRN, date of birth, ID band Patient awake    Reviewed: Allergy & Precautions, NPO status , Patient's Chart, lab work & pertinent test results  History of Anesthesia Complications Negative for: history of anesthetic complications  Airway Mallampati: II  TM Distance: >3 FB Neck ROM: Full    Dental  (+) Implants, Dental Advidsory Given   Pulmonary neg shortness of breath, sleep apnea and Continuous Positive Airway Pressure Ventilation , neg COPD, neg recent URI,    breath sounds clear to auscultation- rhonchi (-) wheezing      Cardiovascular Exercise Tolerance: Good hypertension, (-) angina(-) CAD and (-) Past MI + dysrhythmias Atrial Fibrillation  Rhythm:Regular Rate:Normal - Systolic murmurs and - Diastolic murmurs    Neuro/Psych negative neurological ROS  negative psych ROS   GI/Hepatic negative GI ROS, Neg liver ROS,   Endo/Other  negative endocrine ROSneg diabetes  Renal/GU negative Renal ROS     Musculoskeletal negative musculoskeletal ROS (+)   Abdominal (+) + obese,   Peds  Hematology negative hematology ROS (+)   Anesthesia Other Findings Past Medical History: No date: Atrial fibrillation ([place]) No date: Central hearing loss No date: Colon polyp No date: Dysrhythmia No date: Hemorrhoid No date: Hydrocele 2012, 2013, 2015,2017: Skin cancer     Comment: basel cell 2000: Sleep apnea     Comment: Uses Cpap machine   Reproductive/Obstetrics                             Anesthesia Physical  Anesthesia Plan  ASA: 2  Anesthesia Plan: General   Post-op Pain Management:    Induction: Intravenous  PONV Risk Score and Plan: 2 and TIVA and Propofol infusion  Airway Management Planned: Natural Airway and Nasal Cannula  Additional Equipment:   Intra-op Plan:   Post-operative Plan:   Informed Consent: I have reviewed the patients  History and Physical, chart, labs and discussed the procedure including the risks, benefits and alternatives for the proposed anesthesia with the patient or authorized representative who has indicated his/her understanding and acceptance.     Dental advisory given  Plan Discussed with: CRNA and Anesthesiologist  Anesthesia Plan Comments:         Anesthesia Quick Evaluation

## 2021-01-28 ENCOUNTER — Encounter: Payer: Self-pay | Admitting: General Surgery

## 2021-01-28 LAB — SURGICAL PATHOLOGY

## 2021-01-29 NOTE — Anesthesia Postprocedure Evaluation (Addendum)
Anesthesia Post Note  Patient: Tyler Ritter.  Procedure(s) Performed: COLONOSCOPY WITH PROPOFOL  Patient location during evaluation: Endoscopy Anesthesia Type: General Level of consciousness: awake and alert Pain management: pain level controlled Vital Signs Assessment: post-procedure vital signs reviewed and stable Respiratory status: spontaneous breathing, nonlabored ventilation, respiratory function stable and patient connected to nasal cannula oxygen Cardiovascular status: blood pressure returned to baseline and stable Postop Assessment: no apparent nausea or vomiting Anesthetic complications: no   No notable events documented.   Last Vitals:  Vitals:   01/27/21 0937 01/27/21 1007  BP: 128/74 (!) 150/74  Pulse:  (!) 58  Temp: (!) 36.2 C   SpO2:      Last Pain:  Vitals:   01/27/21 0937  TempSrc: Temporal                 Martha Clan

## 2022-01-19 ENCOUNTER — Encounter
Admission: RE | Disposition: A | Discharge status: Home / Self Care | Payer: Self-pay | Source: Home / Self Care | Attending: Cardiology

## 2022-01-19 ENCOUNTER — Other Ambulatory Visit: Payer: Self-pay

## 2022-01-19 ENCOUNTER — Encounter: Payer: Self-pay | Admitting: Cardiology

## 2022-01-19 ENCOUNTER — Observation Stay
Admission: RE | Admit: 2022-01-19 | Discharge: 2022-01-20 | Disposition: A | Discharge status: Home / Self Care | Payer: Medicare HMO | Attending: Cardiology | Admitting: Cardiology

## 2022-01-19 DIAGNOSIS — Z006 Encounter for examination for normal comparison and control in clinical research program: Secondary | ICD-10-CM | POA: Diagnosis not present

## 2022-01-19 DIAGNOSIS — I482 Chronic atrial fibrillation, unspecified: Secondary | ICD-10-CM | POA: Insufficient documentation

## 2022-01-19 DIAGNOSIS — I495 Sick sinus syndrome: Secondary | ICD-10-CM | POA: Diagnosis not present

## 2022-01-19 DIAGNOSIS — R001 Bradycardia, unspecified: Secondary | ICD-10-CM | POA: Diagnosis present

## 2022-01-19 HISTORY — PX: PACEMAKER LEADLESS INSERTION: EP1219

## 2022-01-19 SURGERY — PACEMAKER LEADLESS INSERTION
Anesthesia: Moderate Sedation

## 2022-01-19 MED ORDER — SODIUM CHLORIDE 0.9 % IV SOLN: 250.0000 mL | INTRAVENOUS | Status: DC | PRN

## 2022-01-19 MED ORDER — SODIUM CHLORIDE 0.9 % IV SOLN: INTRAVENOUS | Status: DC

## 2022-01-19 MED ORDER — MIDAZOLAM HCL 2 MG/2ML IJ SOLN
INTRAMUSCULAR | Status: AC
  Filled 2022-01-19: qty 2

## 2022-01-19 MED ORDER — MAGNESIUM CHLORIDE 64 MG PO TBEC
1.0000 | DELAYED_RELEASE_TABLET | Freq: Every day | ORAL | Status: DC
  Administered 2022-01-19: 64 mg via ORAL
  Filled 2022-01-19 (×2): qty 1

## 2022-01-19 MED ORDER — ACETAMINOPHEN 325 MG PO TABS
650.0000 mg | ORAL_TABLET | ORAL | Status: DC | PRN
  Administered 2022-01-20: 650 mg via ORAL
  Filled 2022-01-19: qty 2

## 2022-01-19 MED ORDER — LOSARTAN POTASSIUM 50 MG PO TABS
100.0000 mg | ORAL_TABLET | Freq: Every day | ORAL | Status: DC
  Filled 2022-01-19: qty 2

## 2022-01-19 MED ORDER — HEPARIN SODIUM (PORCINE) 1000 UNIT/ML IJ SOLN
INTRAMUSCULAR | Status: AC
  Filled 2022-01-19: qty 10

## 2022-01-19 MED ORDER — LIDOCAINE HCL 1 % IJ SOLN
INTRAMUSCULAR | Status: AC
  Filled 2022-01-19: qty 40

## 2022-01-19 MED ORDER — HYDROCHLOROTHIAZIDE 12.5 MG PO TABS
12.5000 mg | ORAL_TABLET | Freq: Every day | ORAL | Status: DC
  Filled 2022-01-19: qty 1

## 2022-01-19 MED ORDER — SODIUM CHLORIDE 0.9% FLUSH: 3.0000 mL | INTRAVENOUS | Status: DC | PRN

## 2022-01-19 MED ORDER — MIDAZOLAM HCL 2 MG/2ML IJ SOLN
INTRAMUSCULAR | Status: DC | PRN
  Administered 2022-01-19: 1 mg via INTRAVENOUS
  Administered 2022-01-19: .5 mg via INTRAVENOUS

## 2022-01-19 MED ORDER — HEPARIN (PORCINE) IN NACL 1000-0.9 UT/500ML-% IV SOLN
INTRAVENOUS | Status: DC | PRN
  Administered 2022-01-19: 1000 mL

## 2022-01-19 MED ORDER — FENTANYL CITRATE (PF) 100 MCG/2ML IJ SOLN
INTRAMUSCULAR | Status: DC | PRN
  Administered 2022-01-19: 12.5 ug via INTRAVENOUS
  Administered 2022-01-19: 25 ug via INTRAVENOUS

## 2022-01-19 MED ORDER — SODIUM CHLORIDE 0.9% FLUSH
3.0000 mL | Freq: Two times a day (BID) | INTRAVENOUS | Status: DC
  Administered 2022-01-19: 3 mL via INTRAVENOUS

## 2022-01-19 MED ORDER — SODIUM CHLORIDE 0.9% FLUSH: 3.0000 mL | Freq: Two times a day (BID) | INTRAVENOUS | Status: DC

## 2022-01-19 MED ORDER — ONDANSETRON HCL 4 MG/2ML IJ SOLN: 4.0000 mg | Freq: Four times a day (QID) | INTRAMUSCULAR | Status: DC | PRN

## 2022-01-19 MED ORDER — HEPARIN (PORCINE) IN NACL 1000-0.9 UT/500ML-% IV SOLN
INTRAVENOUS | Status: AC
  Filled 2022-01-19: qty 1000

## 2022-01-19 MED ORDER — IOHEXOL 300 MG/ML  SOLN
INTRAMUSCULAR | Status: DC | PRN
  Administered 2022-01-19: 9 mL

## 2022-01-19 MED ORDER — LIDOCAINE HCL (PF) 1 % IJ SOLN
INTRAMUSCULAR | Status: DC | PRN
  Administered 2022-01-19: 20 mL

## 2022-01-19 MED ORDER — HEPARIN SODIUM (PORCINE) 1000 UNIT/ML IJ SOLN
INTRAMUSCULAR | Status: DC | PRN
  Administered 2022-01-19: 5000 [IU] via INTRAVENOUS

## 2022-01-19 MED ORDER — FENTANYL CITRATE (PF) 100 MCG/2ML IJ SOLN
INTRAMUSCULAR | Status: AC
  Filled 2022-01-19: qty 2

## 2022-01-19 SURGICAL SUPPLY — 15 items
DILATOR VESSEL 38 20CM 12FR (INTRODUCER) IMPLANT
DILATOR VESSEL 38 20CM 14FR (INTRODUCER) IMPLANT
DILATOR VESSEL 38 20CM 18FR (INTRODUCER) IMPLANT
DILATOR VESSEL 38 20CM 8FR (INTRODUCER) IMPLANT
MICRA AV TRANSCATH PACING SYS (Pacemaker) ×1 IMPLANT
MICRA INTRODUCER SHEATH (SHEATH) ×1
NDL PERC 18GX7CM (NEEDLE) IMPLANT
NEEDLE PERC 18GX7CM (NEEDLE) ×1 IMPLANT
PACK CARDIAC CATH (CUSTOM PROCEDURE TRAY) ×1 IMPLANT
PAD ELECT DEFIB RADIOL ZOLL (MISCELLANEOUS) IMPLANT
SHEATH AVANTI 7FRX11 (SHEATH) IMPLANT
SHEATH INTRODUCER MICRA (SHEATH) IMPLANT
SUT SILK 0 FSL (SUTURE) IMPLANT
SYSTEM PACING TRNSCTH AV MICRA (Pacemaker) IMPLANT
WIRE AMPLATZ SS-J .035X180CM (WIRE) IMPLANT

## 2022-01-19 NOTE — Plan of Care (Signed)
  Problem: Education: Goal: Knowledge of cardiac device and self-care will improve Outcome: Progressing Goal: Ability to safely manage health related needs after discharge will improve Outcome: Progressing Goal: Individualized Educational Video(s) Outcome: Progressing   Problem: Cardiac: Goal: Ability to achieve and maintain adequate cardiopulmonary perfusion will improve Outcome: Progressing   Problem: Education: Goal: Understanding of CV disease, CV risk reduction, and recovery process will improve Outcome: Progressing Goal: Individualized Educational Video(s) Outcome: Progressing   Problem: Activity: Goal: Ability to return to baseline activity level will improve Outcome: Progressing   Problem: Cardiovascular: Goal: Ability to achieve and maintain adequate cardiovascular perfusion will improve Outcome: Progressing Goal: Vascular access site(s) Level 0-1 will be maintained Outcome: Progressing   Problem: Health Behavior/Discharge Planning: Goal: Ability to safely manage health-related needs after discharge will improve Outcome: Progressing   Problem: Education: Goal: Knowledge of General Education information will improve Description: Including pain rating scale, medication(s)/side effects and non-pharmacologic comfort measures Outcome: Progressing   Problem: Health Behavior/Discharge Planning: Goal: Ability to manage health-related needs will improve Outcome: Progressing   Problem: Clinical Measurements: Goal: Ability to maintain clinical measurements within normal limits will improve Outcome: Progressing Goal: Will remain free from infection Outcome: Progressing Goal: Diagnostic test results will improve Outcome: Progressing Goal: Respiratory complications will improve Outcome: Progressing Goal: Cardiovascular complication will be avoided Outcome: Progressing   Problem: Activity: Goal: Risk for activity intolerance will decrease Outcome: Progressing    Problem: Nutrition: Goal: Adequate nutrition will be maintained Outcome: Progressing   Problem: Coping: Goal: Level of anxiety will decrease Outcome: Progressing   Problem: Elimination: Goal: Will not experience complications related to bowel motility Outcome: Progressing Goal: Will not experience complications related to urinary retention Outcome: Progressing   Problem: Pain Managment: Goal: General experience of comfort will improve Outcome: Progressing   Problem: Safety: Goal: Ability to remain free from injury will improve Outcome: Progressing   Problem: Skin Integrity: Goal: Risk for impaired skin integrity will decrease Outcome: Progressing   

## 2022-01-20 DIAGNOSIS — I495 Sick sinus syndrome: Secondary | ICD-10-CM | POA: Diagnosis not present

## 2022-01-20 LAB — BASIC METABOLIC PANEL
Anion gap: 8 (ref 5–15)
BUN: 22 mg/dL (ref 8–23)
CO2: 26 mmol/L (ref 22–32)
Calcium: 9.2 mg/dL (ref 8.9–10.3)
Chloride: 107 mmol/L (ref 98–111)
Creatinine, Ser: 0.75 mg/dL (ref 0.61–1.24)
GFR, Estimated: >60 mL/min (ref >60)
Glucose, Bld: 100 mg/dL — ABNORMAL HIGH (ref 70–99)
Potassium: 3.8 mmol/L (ref 3.5–5.1)
Sodium: 141 mmol/L (ref 135–145)

## 2022-01-20 MED ORDER — ACETAMINOPHEN 325 MG PO TABS: 650.0000 mg | ORAL_TABLET | ORAL | 1 refills | Status: DC | PRN

## 2022-01-20 NOTE — Discharge Instructions (Signed)
Resume Xarelto on 01/21/2022.  Patient may shower 01/22/2022 at which time may remove dressing in right groin.

## 2022-01-20 NOTE — Discharge Summary (Signed)
Physician Discharge Summary  Patient ID: Tyler Ritter. MRN: [id number] DOB/AGE: [birth year] 81 y.o.  Admit date: 01/19/2022 Discharge date: 01/20/2022  Primary Discharge Diagnosis bradycardia Secondary Discharge Diagnosis atrial fibrillation  Significant Diagnostic Studies: yes  Consults: None  Hospital Course: Patient has a history of chronic atrial fibrillation, with symptomatic bradycardia and pauses.  Underwent elective Micra AV leadless pacemaker implantation on 1/93/7902 without complication.  Patient was observed overnight on telemetry out incident.  Telemetry revealed ventricular pacing at 60 bpm.  He was discharged home in stable condition 01/20/2022 with follow-up with Dr. Nehemiah Massed in 1 week.   Discharge Exam: Blood pressure (!) 163/88, pulse 64, temperature 98.1 F (36.7 C), resp. rate 19, height '5\' 11"'$  (1.803 m), weight 112.8 kg, SpO2 100 %.   General appearance: alert Labs:   Lab Results  Component Value Date   WBC 7.9 11/08/2014   HGB 15.1 11/08/2014   HCT 43.4 11/08/2014   MCV 86.3 11/08/2014   PLT 148 (L) 11/08/2014    Recent Labs  Lab 01/20/22 0548  NA 141  K 3.8  CL 107  CO2 26  BUN 22  CREATININE 0.75  CALCIUM 9.2  GLUCOSE 100*      Radiology:  EKG: Ventricular pacing  FOLLOW UP PLANS AND APPOINTMENTS  Allergies as of 01/20/2022   No Known Allergies      Medication List     STOP taking these medications    FISH OIL PO       TAKE these medications    acetaminophen 500 MG tablet Commonly known as: TYLENOL Take 500 mg by mouth every 6 (six) hours as needed. What changed: Another medication with the same name was added. Make sure you understand how and when to take each.   acetaminophen 325 MG tablet Commonly known as: TYLENOL Take 2 tablets (650 mg total) by mouth every 4 (four) hours as needed for headache or mild pain. What changed: You were already taking a medication with the same name, and this prescription was  added. Make sure you understand how and when to take each.   Alpha-Lipoic Acid 100 MG Caps Take by mouth.   ascorbic acid 500 MG tablet Commonly known as: VITAMIN C Take by mouth.   azelastine 0.1 % nasal spray Commonly known as: ASTELIN Place 2 sprays into both nostrils 2 (two) times daily. Use in each nostril as directed   calcium citrate-vitamin D 315-200 MG-UNIT tablet Commonly known as: CITRACAL+D Take 1 tablet by mouth daily.   diltiazem 120 MG 24 hr capsule Commonly known as: DILACOR XR Take 120 mg by mouth daily.   fluticasone 50 MCG/ACT nasal spray Commonly known as: FLONASE Place into both nostrils as needed for allergies or rhinitis.   losartan-hydrochlorothiazide 100-12.5 MG tablet Commonly known as: HYZAAR Take 1 tablet by mouth daily.   MAG-DELAY PO Take 535 mg by mouth daily.   multivitamin tablet Take 1 tablet by mouth daily.   rivaroxaban 20 MG Tabs tablet Commonly known as: XARELTO Take 1 tablet (20 mg total) by mouth daily with supper.   Vitamin D3 50 MCG (2000 UT) capsule Take by mouth.        Follow-up Information     Corey Skains, MD Follow up in 1 week(s).   Specialty: Cardiology Contact information: [facility] [place] [address] [phone]                 BRING ALL MEDICATIONS WITH  YOU TO FOLLOW UP APPOINTMENTS  Time spent with patient to include physician time: 25 minutes Signed:  Isaias Cowman MD, PhD, [facility] 01/20/2022, 8:30 AM

## 2022-01-20 NOTE — Progress Notes (Signed)
  Transition of Care [facility]) Screening Note   Patient Details  Name: Tyler Ritter. Date of Birth: [birth year]   Transition of Care [facility] CM/SW Contact:    Alberteen Sam, LCSW Phone Number: 01/20/2022, 8:51 AM    Transition of Care Department [facility]) has reviewed patient and no TOC needs have been identified at this time. We will continue to monitor patient advancement through interdisciplinary progression rounds. If new patient transition needs arise, please place a TOC consult.  [place], [place]

## 2022-01-20 NOTE — Progress Notes (Signed)
Patient adequate for discharge.

## 2022-01-27 DIAGNOSIS — I495 Sick sinus syndrome: Secondary | ICD-10-CM

## 2022-07-18 ENCOUNTER — Ambulatory Visit: Payer: Medicare HMO | Attending: Otolaryngology

## 2022-07-18 DIAGNOSIS — G4733 Obstructive sleep apnea (adult) (pediatric): Secondary | ICD-10-CM | POA: Diagnosis present

## 2023-04-12 ENCOUNTER — Other Ambulatory Visit: Payer: Self-pay

## 2023-04-12 ENCOUNTER — Emergency Department: Payer: Medicare HMO

## 2023-04-12 ENCOUNTER — Emergency Department
Admission: EM | Admit: 2023-04-12 | Discharge: 2023-04-12 | Disposition: A | Discharge status: Home / Self Care | Payer: Medicare HMO | Attending: Emergency Medicine | Admitting: Emergency Medicine

## 2023-04-12 DIAGNOSIS — Z7901 Long term (current) use of anticoagulants: Secondary | ICD-10-CM | POA: Diagnosis not present

## 2023-04-12 DIAGNOSIS — N132 Hydronephrosis with renal and ureteral calculous obstruction: Secondary | ICD-10-CM | POA: Diagnosis not present

## 2023-04-12 DIAGNOSIS — R109 Unspecified abdominal pain: Secondary | ICD-10-CM | POA: Diagnosis present

## 2023-04-12 DIAGNOSIS — I10 Essential (primary) hypertension: Secondary | ICD-10-CM | POA: Insufficient documentation

## 2023-04-12 DIAGNOSIS — N200 Calculus of kidney: Secondary | ICD-10-CM

## 2023-04-12 LAB — CBC
HCT: 40.4 % (ref 39.0–52.0)
Hemoglobin: 13.6 g/dL (ref 13.0–17.0)
MCH: 31.6 pg (ref 26.0–34.0)
MCHC: 33.7 g/dL (ref 30.0–36.0)
MCV: 94 fL (ref 80.0–100.0)
Platelets: 147 10*3/uL — ABNORMAL LOW (ref 150–400)
RBC: 4.3 MIL/uL (ref 4.22–5.81)
RDW: 13.4 % (ref 11.5–15.5)
WBC: 9.8 10*3/uL (ref 4.0–10.5)
nRBC: 0 % (ref 0.0–0.2)

## 2023-04-12 LAB — BASIC METABOLIC PANEL
Anion gap: 10 (ref 5–15)
BUN: 24 mg/dL — ABNORMAL HIGH (ref 8–23)
CO2: 23 mmol/L (ref 22–32)
Calcium: 9.5 mg/dL (ref 8.9–10.3)
Chloride: 106 mmol/L (ref 98–111)
Creatinine, Ser: 0.99 mg/dL (ref 0.61–1.24)
GFR, Estimated: >60 mL/min (ref >60)
Glucose, Bld: 114 mg/dL — ABNORMAL HIGH (ref 70–99)
Potassium: 3.7 mmol/L (ref 3.5–5.1)
Sodium: 139 mmol/L (ref 135–145)

## 2023-04-12 LAB — HEPATIC FUNCTION PANEL
ALT: 24 U/L (ref 0–44)
AST: 25 U/L (ref 15–41)
Albumin: 4.4 g/dL (ref 3.5–5.0)
Alkaline Phosphatase: 64 U/L (ref 38–126)
Bilirubin, Direct: 0.2 mg/dL (ref 0.0–0.2)
Indirect Bilirubin: 0.8 mg/dL (ref 0.3–0.9)
Total Bilirubin: 1 mg/dL (ref <1.2)
Total Protein: 7.4 g/dL (ref 6.5–8.1)

## 2023-04-12 LAB — URINALYSIS, ROUTINE W REFLEX MICROSCOPIC
Bacteria, UA: NONE SEEN
Bilirubin Urine: NEGATIVE
Glucose, UA: NEGATIVE mg/dL
Ketones, ur: NEGATIVE mg/dL
Leukocytes,Ua: NEGATIVE
Nitrite: NEGATIVE
Protein, ur: NEGATIVE mg/dL
RBC / HPF: >50 RBC/hpf (ref 0–5)
Specific Gravity, Urine: 1.017 (ref 1.005–1.030)
Squamous Epithelial / HPF: 0 /[HPF] (ref 0–5)
pH: 5 (ref 5.0–8.0)

## 2023-04-12 LAB — LIPASE, BLOOD: Lipase: 25 U/L (ref 11–51)

## 2023-04-12 MED ORDER — MORPHINE SULFATE (PF) 4 MG/ML IV SOLN
4.0000 mg | Freq: Once | INTRAVENOUS | Status: AC
  Administered 2023-04-12: 4 mg via INTRAVENOUS
  Filled 2023-04-12: qty 1

## 2023-04-12 MED ORDER — HYDROCODONE-ACETAMINOPHEN 5-325 MG PO TABS
1.0000 | ORAL_TABLET | Freq: Once | ORAL | Status: AC
Start: 2023-04-12 — End: 2023-04-12
  Administered 2023-04-12: 1 via ORAL
  Filled 2023-04-12: qty 1

## 2023-04-12 MED ORDER — OXYCODONE HCL 5 MG PO CAPS: 5.0000 mg | ORAL_CAPSULE | Freq: Four times a day (QID) | ORAL | 0 refills | Status: DC | PRN

## 2023-04-12 MED ORDER — ONDANSETRON HCL 4 MG PO TABS: 4.0000 mg | ORAL_TABLET | Freq: Four times a day (QID) | ORAL | 0 refills | Status: AC | PRN

## 2023-04-12 MED ORDER — ONDANSETRON HCL 4 MG/2ML IJ SOLN
4.0000 mg | Freq: Once | INTRAMUSCULAR | Status: AC
  Administered 2023-04-12: 4 mg via INTRAVENOUS
  Filled 2023-04-12: qty 2

## 2023-04-12 MED ORDER — SODIUM CHLORIDE 0.9 % IV BOLUS
1000.0000 mL | Freq: Once | INTRAVENOUS | Status: AC
  Administered 2023-04-12: 1000 mL via INTRAVENOUS

## 2023-04-12 MED ORDER — TAMSULOSIN HCL 0.4 MG PO CAPS: 0.4000 mg | ORAL_CAPSULE | Freq: Every day | ORAL | 0 refills | Status: AC

## 2023-04-12 MED ORDER — KETOROLAC TROMETHAMINE 30 MG/ML IJ SOLN
15.0000 mg | Freq: Once | INTRAMUSCULAR | Status: AC
  Administered 2023-04-12: 15 mg via INTRAVENOUS
  Filled 2023-04-12: qty 1

## 2023-04-12 NOTE — ED Notes (Signed)
Assumed patient care at approximately 1100 and received report from the previous nurse.

## 2023-04-12 NOTE — ED Triage Notes (Signed)
Pt reports L flank pain. Onset 2100 last night. Denies urinary symptoms. Denies n/v/d. Pt ambulatory to triage. Alert and oriented. Breathing unlabored speaking in full sentences.

## 2023-04-12 NOTE — ED Provider Notes (Signed)
[facility] Provider Note    Event Date/Time   First MD Initiated Contact with Patient 04/12/23 [phone]     (approximate)   History   Flank Pain   HPI  Tyler Ritter. is a 82 year old male with history of sick sinus syndrome, atrial flutter on Xarelto, HTN presenting to the ER for evaluation of right flank pain (left flank pain noted in triage, patient corrects this to right side on my review).  Around 9 PM last night, patient had onset of right sided flank pain.  Does report associated nausea without vomiting.  No diarrhea.  No history of similar.  Pain has been constant since that time.  No history of renal stones.  History of cholecystectomy.  Denies dysuria urinary frequency.     Physical Exam   Triage Vital Signs: ED Triage Vitals  Encounter Vitals Group     BP 04/12/23 0625 (!) 154/84     Systolic BP Percentile --      Diastolic BP Percentile --      Pulse Rate 04/12/23 0625 74     Resp 04/12/23 0625 20     Temp 04/12/23 0625 97.7 F (36.5 C)     Temp src --      SpO2 04/12/23 0625 96 %     Weight 04/12/23 0625 251 lb (113.9 kg)     Height 04/12/23 0625 5\' 11"  (1.803 m)     Head Circumference --      Peak Flow --      Pain Score 04/12/23 0625 0     Pain Loc --      Pain Education --      Exclude from Growth Chart --     Most recent vital signs: Vitals:   04/12/23 0625  BP: (!) 154/84  Pulse: 74  Resp: 20  Temp: 97.7 F (36.5 C)  SpO2: 96%     General: Awake, interactive  CV:  Regular rate, good peripheral perfusion.  Resp:  Unlabored respirations, lungs clear to auscultation Abd:  Nondistended, soft, nontender to palpation Neuro:  Symmetric facial movement, fluid speech   ED Results / Procedures / Treatments   Labs (all labs ordered are listed, but only abnormal results are displayed) Labs Reviewed  URINALYSIS, ROUTINE W REFLEX MICROSCOPIC - Abnormal; Notable for the following components:      Result Value    Color, Urine YELLOW (*)    APPearance HAZY (*)    Hgb urine dipstick LARGE (*)    All other components within normal limits  BASIC METABOLIC PANEL - Abnormal; Notable for the following components:   Glucose, Bld 114 (*)    BUN 24 (*)    All other components within normal limits  CBC - Abnormal; Notable for the following components:   Platelets 147 (*)    All other components within normal limits  HEPATIC FUNCTION PANEL  LIPASE, BLOOD     EKG EKG independently reviewed interpreted by myself (ER attending) demonstrates:    RADIOLOGY Imaging independently reviewed and interpreted by myself demonstrates:  CT renal stone with concern for right-sided renal stone on my review  PROCEDURES:  Critical Care performed: No  Procedures   MEDICATIONS ORDERED IN ED: Medications  ondansetron (ZOFRAN) injection 4 mg (4 mg Intravenous Given 04/12/23 0742)  sodium chloride 0.9 % bolus 1,000 mL (1,000 mLs Intravenous New Bag/Given 04/12/23 0744)  morphine (PF) 4 MG/ML injection 4 mg (4 mg Intravenous Given 04/12/23 0743)  ketorolac (  TORADOL) 30 MG/ML injection 15 mg (15 mg Intravenous Given 04/12/23 1041)  HYDROcodone-acetaminophen (NORCO/VICODIN) 5-325 MG per tablet 1 tablet (1 tablet Oral Given 04/12/23 1041)     IMPRESSION / MDM / ASSESSMENT AND PLAN / ED COURSE  I reviewed the triage vital signs and the nursing notes.  Differential diagnosis includes, but is not limited to, renal stone, pyelonephritis, UTI, other acute intra-abdominal process, musculoskeletal strain  Patient's presentation is most consistent with acute presentation with potential threat to life or bodily function.  82 year old male presenting with flank pain.  Vital stable on presentation.  Labs from triage without critical derangements.  Will add on LFTs, lipase.  Urine pending.  CT does demonstrate 7 mm right-sided obstructing renal stone.  Labs reassuring with normal white blood cell count.  Urine without evidence  of infection.  Reassuring renal function.  Patient with ongoing pain after morphine.  Will trial Toradol and Norco.  Clinical Course as of 04/12/23 1111  Wed Apr 12, 2023  1103 Patient reassessed.  Does report improvement in pain.  He is comfortable with discharge home with outpatient follow-up with urology.  Strict return precautions provided.  Will DC with prescription for Norco, Zofran, Flomax. [NR]    Clinical Course User Index [NR] Trinna Post, MD     FINAL CLINICAL IMPRESSION(S) / ED DIAGNOSES   Final diagnoses:  Kidney stone     Rx / DC Orders   ED Discharge Orders          Ordered    ondansetron (ZOFRAN) 4 MG tablet  Every 6 hours PRN        04/12/23 1110    tamsulosin (FLOMAX) 0.4 MG CAPS capsule  Daily        04/12/23 1110    oxycodone (OXY-IR) 5 MG capsule  Every 6 hours PRN        04/12/23 1111             Note:  This document was prepared using Dragon voice recognition software and may include unintentional dictation errors.   Trinna Post, MD 04/12/23 [phone]

## 2023-04-12 NOTE — Discharge Instructions (Signed)
You were seen in the emergency department today for evaluation of your flank pain.  Your CT scan did show that you have a kidney stone that I suspect is the likely cause of your pain.  Fortunately your urine did not look infected.  You can take Tylenol to help with your pain.  If you have breakthrough pain, I have sent a short course of narcotic pain medicine to your pharmacy.  This can make you drowsy, so do not drive or operate machinery when taking this.  In addition, I have sent a nausea medicine as well as a medicine that can help pass your stone called Flomax.  Follow-up with urologist in the next 1 to 2 weeks for reevaluation.  Return to the ER for new or worsening symptoms including uncontrolled pain despite pain medication, inability to tolerate food or liquids, fevers, or any other new or concerning symptoms.

## 2023-04-13 ENCOUNTER — Telehealth: Payer: Self-pay | Admitting: Urology

## 2023-04-14 ENCOUNTER — Ambulatory Visit
Admission: RE | Admit: 2023-04-14 | Discharge: 2023-04-14 | Disposition: A | Discharge status: Home / Self Care | Payer: Medicare HMO | Source: Ambulatory Visit | Attending: Urology | Admitting: Urology

## 2023-04-14 ENCOUNTER — Encounter: Payer: Self-pay | Admitting: Urology

## 2023-04-14 ENCOUNTER — Other Ambulatory Visit: Payer: Self-pay

## 2023-04-14 ENCOUNTER — Telehealth: Payer: Self-pay

## 2023-04-14 ENCOUNTER — Ambulatory Visit: Payer: Medicare HMO | Admitting: Urology

## 2023-04-14 VITALS — BP 133/82 | HR 71 | Ht 71.0 in | Wt 254.0 lb

## 2023-04-14 DIAGNOSIS — N201 Calculus of ureter: Secondary | ICD-10-CM

## 2023-04-14 DIAGNOSIS — N200 Calculus of kidney: Secondary | ICD-10-CM

## 2023-04-14 DIAGNOSIS — N23 Unspecified renal colic: Secondary | ICD-10-CM | POA: Diagnosis not present

## 2023-04-14 DIAGNOSIS — N132 Hydronephrosis with renal and ureteral calculous obstruction: Secondary | ICD-10-CM

## 2023-04-14 DIAGNOSIS — N1330 Unspecified hydronephrosis: Secondary | ICD-10-CM | POA: Diagnosis not present

## 2023-04-14 LAB — URINALYSIS, COMPLETE
Bilirubin, UA: NEGATIVE
Glucose, UA: NEGATIVE
Leukocytes,UA: NEGATIVE
Nitrite, UA: NEGATIVE
Specific Gravity, UA: >1.03 — ABNORMAL HIGH (ref 1.005–1.030)
Urobilinogen, Ur: 0.2 mg/dL (ref 0.2–1.0)
pH, UA: 5.5 (ref 5.0–7.5)

## 2023-04-14 LAB — MICROSCOPIC EXAMINATION: RBC, Urine: >30 /[HPF] — AB (ref 0–2)

## 2023-04-14 MED ORDER — OXYCODONE HCL 5 MG PO CAPS: 5.0000 mg | ORAL_CAPSULE | Freq: Four times a day (QID) | ORAL | 0 refills | Status: AC | PRN

## 2023-04-14 NOTE — Progress Notes (Signed)
  Perioperative Services Pre-Admission/Anesthesia Testing     Date: 04/14/23  Name: Tyler Ritter. MRN:   [id number]  Re: Request from surgery for clearance prior to scheduled procedure  Patient is scheduled to undergo a CYSTOSCOPY/URETEROSCOPY/HOLMIUM LASER/STENT PLACEMENT on 04/20/2023 with Dr. Irineo Axon, MD. Patient has not been scheduled for his PAT appointment at this point, thus has not undergone review by PAT RN and/or APP. Received communication from primary attending surgeon's office requesting that patient be submitted for clearance from CARDIOLOGY.   PROVIDER SPECIALTY FAXED TO   Rudean Hitt, MD  Cardiology  [phone]   Plan:  Clearance documents generated. Patient also has an implanted cardiac device that will require perioperative management instructions. Forms for both clearance and device have been faxed to appropriate provider(s) as noted above. Note will be updated to reflect communication with provider's office as it relates to clearance being provided and/or the need for office visit prior to clearance for surgery being issued.   Quentin Mulling, MSN, APRN, FNP-C, CEN [facility]  Perioperative Services Nurse Practitioner Phone: [phone] 04/14/23 2:34 PM  NOTE: This note has been prepared using Dragon dictation software. Despite my best ability to proofread, there is always the potential that unintentional transcriptional errors may still occur from this process.

## 2023-04-14 NOTE — Patient Instructions (Signed)
Miralax and sennekots

## 2023-04-14 NOTE — Telephone Encounter (Signed)
Per Dr. Lonna Cobb, Patient is to be scheduled for Cystoscopy with Laser Lithotripsy and Stent Placement   Tyler Ritter was contacted and possible surgical dates were discussed, Thursday December 26th, 2024 was agreed upon for surgery.   Patient was directed to call [phone] between 1-3pm the day before surgery to find out surgical arrival time.  Instructions were given not to eat or drink from midnight on the night before surgery and have a driver for the day of surgery. On the surgery day patient was instructed to enter through the [facility] entrance of [facility] report the Same Day Surgery desk.   Pre-Admit Testing will be in contact via phone to set up an interview with the anesthesia team to review your history and medications prior to surgery.   Reminder of this information was sent via MyChart to the patient.   Patient is to hold anticoag's per Dr. Lonna Cobb.  Currently taking Xarelto by Tyler Ritter.  Clearance form was faxed to [facility] Cards.  Awaiting response

## 2023-04-14 NOTE — Progress Notes (Signed)
I,Amy L Pierron,acting as a scribe for Riki Altes, MD.,have documented all relevant documentation on the behalf of Riki Altes, MD,as directed by  Riki Altes, MD while in the presence of Riki Altes, MD.  04/14/2023 6:07 PM   Su Ley. 1940-05-13 [id number]  Referring provider: Lauro Regulus, MD [address] [facility] [address] [place],  [place] [address]  Chief Complaint  Patient presents with   Nephrolithiasis    HPI: Tyler Ritter. is a 82 y.o.  male who presents in follow-up of a recent ED visit for renal colic.  Presented to [facility] ED 04/12/23 with acute onset of right flank pain. Non-radiating and rated severe. He had nausea without vomiting. No fever or chills. Urinalysis showed >50 RBC and a renal stone CT showed mild right hydronephrosis secondary to a 7 mm right proximal ureteral calculus. He had small, non-obstructing bilateral renal calculi. No previous history of stone disease.  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 Since his ED visit he has had intermittent pain. However, it is controlled with oxycodone, which he states makes him sleepy.   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   Sleep apnea 2000   Uses Cpap machine   Wears hearing aid in both ears     Surgical History: Past Surgical History:  Procedure Laterality Date   CATARACT EXTRACTION W/PHACO Left 10/24/2016   Procedure: CATARACT EXTRACTION PHACO AND INTRAOCULAR LENS PLACEMENT (IOC)  Left;  Surgeon: Lockie Mola, MD;  Location: [facility];  Service: Ophthalmology;  Laterality: Left;  sleep apnea   CATARACT EXTRACTION W/PHACO Right 02/01/2017   Procedure: CATARACT EXTRACTION PHACO AND INTRAOCULAR LENS PLACEMENT (IOC)  COMPLICATED RIGHT;  Surgeon: Lockie Mola, MD;  Location: [facility];  Service: Ophthalmology;  Laterality: Right;  sleep apnea requests early   CHOLECYSTECTOMY  2007   CIRCUMCISION, NON-NEWBORN     COLONOSCOPY  2012   COLONOSCOPY WITH PROPOFOL N/A 03/22/2016   Procedure: COLONOSCOPY WITH PROPOFOL;  Surgeon: Earline Mayotte, MD;  Location: [facility] END[facility];  Service: Endoscopy;  Laterality: N/A;   COLONOSCOPY WITH PROPOFOL N/A 01/27/2021   Procedure: COLONOSCOPY WITH PROPOFOL;  Surgeon: Earline Mayotte, MD;  Location: [facility] ENDOSCOPY;  Service: Endoscopy;  Laterality: N/A;   HERNIA REPAIR  1952   HYDROCELE EXCISION / REPAIR     MOHS SURGERY     PACEMAKER LEADLESS INSERTION N/A 01/19/2022   Procedure: PACEMAKER LEADLESS INSERTION;  Surgeon: Marcina Millard, MD;  Location: [facility] INVASIVE CV LAB;  Service: Cardiovascular;  Laterality: N/A;   TONSILLECTOMY      Home Medications:  Allergies as of 04/14/2023   No Known Allergies      Medication List        Accurate as of April 14, 2023  6:07 PM. If you have any questions, ask your nurse or doctor.          acetaminophen 500 MG tablet Commonly known as: TYLENOL Take 500 mg by mouth every 6 (six) hours as needed.   acetaminophen 325 MG tablet Commonly known as: TYLENOL Take 2 tablets (650 mg total) by mouth every 4 (four) hours  as needed for headache or mild pain.   Alpha-Lipoic Acid 100 MG Caps Take by mouth.   ascorbic acid 500 MG tablet Commonly known as: VITAMIN C Take by mouth.   azelastine 0.1 % nasal spray Commonly known as: ASTELIN Place 2 sprays into both nostrils 2 (two) times daily. Use in each nostril as directed   calcium citrate-vitamin D 315-200 MG-UNIT tablet Commonly known as: CITRACAL+D Take 1 tablet by mouth daily.   diltiazem 120 MG 24 hr capsule Commonly known as: DILACOR XR Take 120 mg by mouth daily.   fluticasone 50 MCG/ACT nasal spray Commonly known as:  FLONASE Place into both nostrils as needed for allergies or rhinitis.   losartan-hydrochlorothiazide 100-12.5 MG tablet Commonly known as: HYZAAR Take 1 tablet by mouth daily.   MAG-DELAY PO Take 535 mg by mouth daily.   multivitamin tablet Take 1 tablet by mouth daily.   ondansetron 4 MG tablet Commonly known as: Zofran Take 1 tablet (4 mg total) by mouth every 6 (six) hours as needed for up to 7 days for nausea or vomiting.   oxycodone 5 MG capsule Commonly known as: OXY-IR Take 1 capsule (5 mg total) by mouth every 6 (six) hours as needed for up to 5 days.   rivaroxaban 20 MG Tabs tablet Commonly known as: XARELTO Take 1 tablet (20 mg total) by mouth daily with supper.   tamsulosin 0.4 MG Caps capsule Commonly known as: Flomax Take 1 capsule (0.4 mg total) by mouth daily for 14 days.   Vitamin D3 50 MCG (2000 UT) capsule Take by mouth.        Allergies: No Known Allergies  Family History: Family History  Problem Relation Age of Onset   Cancer Father 37       colon    Social History:  reports that he has never smoked. He has never used smokeless tobacco. He reports that he does not drink alcohol and does not use drugs.   Physical Exam: BP 133/82   Pulse 71   Ht 5\' 11"  (1.803 m)   Wt 254 lb (115.2 kg)   BMI 35.43 kg/m   Constitutional:  Alert and oriented, No acute distress. HEENT: [place] AT, moist mucus membranes.  Trachea midline, no masses. Cardiovascular: No clubbing, cyanosis, or edema. Respiratory: Normal respiratory effort, no increased work of breathing. GI: Abdomen is soft, nontender, nondistended, no abdominal masses Skin: No rashes, bruises or suspicious lesions. Neurologic: Grossly intact, no focal deficits, moving all 4 extremities. Psychiatric: Normal mood and affect.  Laboratory Data: Urinalysis dipstick 3+ blood/ 2+ protein, microscopy >30 RBC.   Pertinent Imaging:  Results for orders placed during the hospital encounter of  04/12/23  CT Renal Stone Study  Narrative CLINICAL DATA:  Left flank pain.  EXAM: CT ABDOMEN AND PELVIS WITHOUT CONTRAST  TECHNIQUE: Multidetector CT imaging of the abdomen and pelvis was performed following the standard protocol without IV contrast.  RADIATION DOSE REDUCTION: This exam was performed according to the departmental dose-optimization program which includes automated exposure control, adjustment of the mA and/or kV according to patient size and/or use of iterative reconstruction technique.  COMPARISON:  11/09/2014  FINDINGS: Lower chest: Moderate pericardial effusion. Heart is mildly enlarged.  Hepatobiliary: No focal liver abnormality is seen. Status post cholecystectomy. No biliary dilatation.  Pancreas: Unremarkable. No pancreatic ductal dilatation or surrounding inflammatory changes.  Spleen: Normal in size without focal abnormality.  Adrenals/Urinary Tract: Adrenal glands are normal. Mild right hydronephrosis due to 7 mm nonobstructing  calculus in the proximal right ureter. Additional mm nonobstructing right renal calculus is also seen. Mm nonobstructing calculus in the upper pole of the left kidney. Left kidney otherwise unremarkable. No significant abnormality of the bladder.  Stomach/Bowel: Descending and sigmoid colon diverticulosis without evidence of acute diverticulitis. Normal appendix.  Vascular/Lymphatic: Calcified plaque seen throughout the abdominal aorta. No aneurysmal dilatation. No enlarged abdominal or pelvic lymph nodes.  Reproductive: Surgical clips in the right inguinal region likely related to prior hernia repair. Small fat containing inguinal hernias are still present.  Other: No abdominal wall hernia or abnormality. No abdominopelvic ascites.  Musculoskeletal: No acute or significant osseous findings.  IMPRESSION: 1. Mild right hydronephrosis due to 7 mm nonobstructing calculus in the proximal right ureter. 2.  Additional bilateral nonobstructing renal calculi. 3. Descending and sigmoid colon diverticulosis without evidence of acute diverticulitis. 4. Moderate pericardial effusion.   Electronically Signed By: Acquanetta Belling M.D. On: 04/12/2023 08:12 CT images were personally reviewed and agree with radiologic interpretation. KUB performed today and was personally reviewed and interpreted.  Assessment & Plan:    1.Right proximal ureteral calculus We discussed various treatment options for urolithiasis including observation with or without medical expulsive therapy, shockwave lithotripsy (SWL), ureteroscopy and laser lithotripsy with stent placement, and percutaneous nephrolithotomy. We discussed that management is based on stone size, location, density, patient co-morbidities, and patient preference.  Stones <20mm in size have a >80% spontaneous passage rate. Data surrounding the use of tamsulosin for medical expulsive therapy is controversial, but meta analyses suggests it is most efficacious for distal stones between 5-15mm in size. Possible side effects include dizziness/lightheadedness, and retrograde ejaculation. SWL has a lower stone free rate in a single procedure, but also a lower complication rate compared to ureteroscopy and avoids a stent and associated stent related symptoms. Possible complications include renal hematoma, steinstrasse, and need for additional treatment. Ureteroscopy with laser lithotripsy and stent placement has a higher stone free rate than SWL in a single procedure, however increased complication rate including possible infection, ureteral injury, bleeding, and stent related morbidity. Common stent related symptoms include dysuria, urgency/frequency, and flank pain. PCNL is the favored treatment for stones >2cm. It involves a small incision in the flank, with complete fragmentation of stones and removal. It has the highest stone free rate, but also the highest complication  rate. Possible complications include bleeding, infection/sepsis, injury to surrounding organs including the pleura, and collecting system injury.  After an extensive discussion of the risks and benefits of the above treatment options, the patient would like to proceed with shockwave lithotripsy. KUB ordered to see if stone visualized on plan xray.  Will need clearance to hold Xarelto pre-procedure. Oxycodone refill. Addendum: The right ureteral calculus is not visualized. Patient will be contacted regarding scheduling ureteroscopy.  [facility] [address] [place], [place] [address] [phone]

## 2023-04-14 NOTE — Progress Notes (Signed)
   [facility] Posting Form  Surgery Date: Date: 04/20/2023  Surgeon: Dr. Irineo Axon, MD  Inpt ( No  )   Outpt (Yes)   Obs ( No  )   Diagnosis: N20.1 Right Ureteral Stone  -CPT: [phone]  Surgery: Cystoscopy with Laser Lithotripsy and Stent Placement   Stop Anticoagulations: Yes, may continue ASA  Cardiac/Medical/Pulmonary Clearance needed: Yes  [facility]  Clearance request sent on: Date: 04/14/23 by Quentin Mulling, NP   *Orders entered into EPIC  Date: 04/14/23   *Case booked in EPIC  Date: 04/14/23  *Notified pt of Surgery: Date: 04/14/23  PRE-OP UA & CX: yes, obtained in clinic today 04/14/2023  *Placed into Prior Authorization Work Angela Nevin Date: 04/14/23  Assistant/laser/rep:No

## 2023-04-14 NOTE — Progress Notes (Signed)
Surgical Physician Order Form [facility]  Dr. Irineo Axon, MD  * Scheduling expectation : 12/26 if able to get cleared  *Length of Case: 60 minutes  *Clearance needed: yes-pacemaker and on Xarelto  *Anticoagulation Instructions: Hold all anticoagulants  *Aspirin Instructions: Okay to continue aspirin if taking  *Post-op visit Date/Instructions: TBD  *Diagnosis: Right Ureteral Stone  *Procedure: right Ureteroscopy w/laser lithotripsy & stent placement (29562)   Additional orders: N/A  -Admit type: OUTpatient  -Anesthesia: General  -VTE Prophylaxis Standing Order SCD's       Other:   -Standing Lab Orders Per Anesthesia    Lab other: UA&Urine Culture-ordered  -Standing Test orders EKG/Chest x-ray per Anesthesia       Test other:   - Medications:  Ancef 2gm IV  -Other orders:  N/A

## 2023-04-14 NOTE — H&P (View-Only) (Signed)
I,Amy L Pierron,acting as a scribe for Riki Altes, MD.,have documented all relevant documentation on the behalf of Riki Altes, MD,as directed by  Riki Altes, MD while in the presence of Riki Altes, MD.  04/14/2023 6:07 PM   Su Ley. [phone]  Referring provider: Lauro Regulus, MD [address] [facility] [place],  [place] [address]  Chief Complaint  Patient presents with   Nephrolithiasis    HPI: Tyler Ritter. is a 82 y.o.  male who presents in follow-up of a recent ED visit for renal colic.  Presented to [facility] ED 04/12/23 with acute onset of right flank pain. Non-radiating and rated severe. He had nausea without vomiting. No fever or chills. Urinalysis showed >50 RBC and a renal stone CT showed mild right hydronephrosis secondary to a 7 mm right proximal ureteral calculus. He had small, non-obstructing bilateral renal calculi. No previous history of stone disease.  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 Since his ED visit he has had intermittent pain. However, it is controlled with oxycodone, which he states makes him sleepy.   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   Sleep apnea 2000   Uses Cpap machine   Wears hearing aid in both ears     Surgical History: Past Surgical History:  Procedure Laterality Date   CATARACT EXTRACTION W/PHACO Left 10/24/2016   Procedure: CATARACT EXTRACTION PHACO AND INTRAOCULAR LENS PLACEMENT (IOC)  Left;  Surgeon: Lockie Mola, MD;  Location: [facility];  Service: Ophthalmology;  Laterality: Left;  sleep apnea   CATARACT EXTRACTION W/PHACO Right 02/01/2017   Procedure: CATARACT EXTRACTION PHACO AND INTRAOCULAR LENS PLACEMENT (IOC)  COMPLICATED RIGHT;  Surgeon: Lockie Mola, MD;  Location: [facility];  Service: Ophthalmology;  Laterality: Right;  sleep apnea requests early   CHOLECYSTECTOMY  2007   CIRCUMCISION, NON-NEWBORN     COLONOSCOPY  2012   COLONOSCOPY WITH PROPOFOL N/A 03/22/2016   Procedure: COLONOSCOPY WITH PROPOFOL;  Surgeon: Earline Mayotte, MD;  Location: [facility];  Service: Endoscopy;  Laterality: N/A;   COLONOSCOPY WITH PROPOFOL N/A 01/27/2021   Procedure: COLONOSCOPY WITH PROPOFOL;  Surgeon: Earline Mayotte, MD;  Location: [facility] ENDOSCOPY;  Service: Endoscopy;  Laterality: N/A;   HERNIA REPAIR  1952   HYDROCELE EXCISION / REPAIR     MOHS SURGERY     PACEMAKER LEADLESS INSERTION N/A 01/19/2022   Procedure: PACEMAKER LEADLESS INSERTION;  Surgeon: Marcina Millard, MD;  Location: [facility] INVASIVE CV LAB;  Service: Cardiovascular;  Laterality: N/A;   TONSILLECTOMY      Home Medications:  Allergies as of 04/14/2023   No Known Allergies      Medication List        Accurate as of April 14, 2023  6:07 PM. If you have any questions, ask your nurse or doctor.          acetaminophen 500 MG tablet Commonly known as: TYLENOL Take 500 mg by mouth every 6 (six) hours as needed.   acetaminophen 325 MG tablet Commonly known as: TYLENOL Take 2 tablets (650 mg total) by mouth every 4 (four) hours  as needed for headache or mild pain.   Alpha-Lipoic Acid 100 MG Caps Take by mouth.   ascorbic acid 500 MG tablet Commonly known as: VITAMIN C Take by mouth.   azelastine 0.1 % nasal spray Commonly known as: ASTELIN Place 2 sprays into both nostrils 2 (two) times daily. Use in each nostril as directed   calcium citrate-vitamin D 315-200 MG-UNIT tablet Commonly known as: CITRACAL+D Take 1 tablet by mouth daily.   diltiazem 120 MG 24 hr capsule Commonly known as: DILACOR XR Take 120 mg by mouth daily.   fluticasone 50 MCG/ACT nasal spray Commonly known as:  FLONASE Place into both nostrils as needed for allergies or rhinitis.   losartan-hydrochlorothiazide 100-12.5 MG tablet Commonly known as: HYZAAR Take 1 tablet by mouth daily.   MAG-DELAY PO Take 535 mg by mouth daily.   multivitamin tablet Take 1 tablet by mouth daily.   ondansetron 4 MG tablet Commonly known as: Zofran Take 1 tablet (4 mg total) by mouth every 6 (six) hours as needed for up to 7 days for nausea or vomiting.   oxycodone 5 MG capsule Commonly known as: OXY-IR Take 1 capsule (5 mg total) by mouth every 6 (six) hours as needed for up to 5 days.   rivaroxaban 20 MG Tabs tablet Commonly known as: XARELTO Take 1 tablet (20 mg total) by mouth daily with supper.   tamsulosin 0.4 MG Caps capsule Commonly known as: Flomax Take 1 capsule (0.4 mg total) by mouth daily for 14 days.   Vitamin D3 50 MCG (2000 UT) capsule Take by mouth.        Allergies: No Known Allergies  Family History: Family History  Problem Relation Age of Onset   Cancer Father 66       colon    Social History:  reports that he has never smoked. He has never used smokeless tobacco. He reports that he does not drink alcohol and does not use drugs.   Physical Exam: BP 133/82   Pulse 71   Ht 5\' 11"  (1.803 m)   Wt 254 lb (115.2 kg)   BMI 35.43 kg/m   Constitutional:  Alert and oriented, No acute distress. HEENT: [place] AT, moist mucus membranes.  Trachea midline, no masses. Cardiovascular: No clubbing, cyanosis, or edema. Respiratory: Normal respiratory effort, no increased work of breathing. GI: Abdomen is soft, nontender, nondistended, no abdominal masses Skin: No rashes, bruises or suspicious lesions. Neurologic: Grossly intact, no focal deficits, moving all 4 extremities. Psychiatric: Normal mood and affect.  Laboratory Data: Urinalysis dipstick 3+ blood/ 2+ protein, microscopy >30 RBC.   Pertinent Imaging:  Results for orders placed during the hospital encounter of  04/12/23  CT Renal Stone Study  Narrative CLINICAL DATA:  Left flank pain.  EXAM: CT ABDOMEN AND PELVIS WITHOUT CONTRAST  TECHNIQUE: Multidetector CT imaging of the abdomen and pelvis was performed following the standard protocol without IV contrast.  RADIATION DOSE REDUCTION: This exam was performed according to the departmental dose-optimization program which includes automated exposure control, adjustment of the mA and/or kV according to patient size and/or use of iterative reconstruction technique.  COMPARISON:  11/09/2014  FINDINGS: Lower chest: Moderate pericardial effusion. Heart is mildly enlarged.  Hepatobiliary: No focal liver abnormality is seen. Status post cholecystectomy. No biliary dilatation.  Pancreas: Unremarkable. No pancreatic ductal dilatation or surrounding inflammatory changes.  Spleen: Normal in size without focal abnormality.  Adrenals/Urinary Tract: Adrenal glands are normal. Mild right hydronephrosis due to 7 mm nonobstructing  calculus in the proximal right ureter. Additional mm nonobstructing right renal calculus is also seen. Mm nonobstructing calculus in the upper pole of the left kidney. Left kidney otherwise unremarkable. No significant abnormality of the bladder.  Stomach/Bowel: Descending and sigmoid colon diverticulosis without evidence of acute diverticulitis. Normal appendix.  Vascular/Lymphatic: Calcified plaque seen throughout the abdominal aorta. No aneurysmal dilatation. No enlarged abdominal or pelvic lymph nodes.  Reproductive: Surgical clips in the right inguinal region likely related to prior hernia repair. Small fat containing inguinal hernias are still present.  Other: No abdominal wall hernia or abnormality. No abdominopelvic ascites.  Musculoskeletal: No acute or significant osseous findings.  IMPRESSION: 1. Mild right hydronephrosis due to 7 mm nonobstructing calculus in the proximal right ureter. 2.  Additional bilateral nonobstructing renal calculi. 3. Descending and sigmoid colon diverticulosis without evidence of acute diverticulitis. 4. Moderate pericardial effusion.   Electronically Signed By: Acquanetta Belling M.D. On: 04/12/2023 08:12 CT images were personally reviewed and agree with radiologic interpretation. KUB performed today and was personally reviewed and interpreted.  Assessment & Plan:    1.Right proximal ureteral calculus We discussed various treatment options for urolithiasis including observation with or without medical expulsive therapy, shockwave lithotripsy (SWL), ureteroscopy and laser lithotripsy with stent placement. We discussed that management is based on stone size, location, density, patient co-morbidities, and patient preference.  Stones <75mm in size have a >80% spontaneous passage rate. Data surrounding the use of tamsulosin for medical expulsive therapy is controversial, but meta analyses suggests it is most efficacious for distal stones between 5-64mm in size. Possible side effects include dizziness/lightheadedness, and retrograde ejaculation. SWL has a lower stone free rate in a single procedure, but also a lower complication rate compared to ureteroscopy and avoids a stent and associated stent related symptoms. Possible complications include renal hematoma, steinstrasse, and need for additional treatment. Ureteroscopy with laser lithotripsy and stent placement has a higher stone free rate than SWL in a single procedure, however increased complication rate including possible infection, ureteral injury, bleeding, and stent related morbidity. Common stent related symptoms include dysuria, urgency/frequency, and flank pain. After an extensive discussion of the risks and benefits of the above treatment options, the patient would like to proceed with shockwave lithotripsy. KUB ordered to see if stone visualized on plan xray.  Will need clearance to hold Xarelto  pre-procedure. Oxycodone refill. Addendum: The right ureteral calculus is not visualized. Patient will be contacted regarding scheduling ureteroscopy.  I have reviewed the above documentation for accuracy and completeness, and I agree with the above.   Riki Altes, MD  [facility] [address], [address] [place], [place] [address] [phone]

## 2023-04-17 LAB — CULTURE, URINE COMPREHENSIVE

## 2023-04-17 NOTE — Pre-Procedure Instructions (Signed)
Called pt to inform him that he needs to stop his Xarelto NOW (04-17-23) per cardiology ([place])-Pt states his last dose was on 04-16-23. Pt instructed not to take anymore Xarelto. Pt verbalized understanding.   Cardiac clearance and Pacemaker form on chart

## 2023-04-19 MED ORDER — LIDOCAINE HCL (PF) 1 % IJ SOLN
INTRAMUSCULAR | Status: AC
  Filled 2023-04-19: qty 30

## 2023-04-19 MED ORDER — ALBUMIN HUMAN 5 % IV SOLN
INTRAVENOUS | Status: AC
  Filled 2023-04-19: qty 250

## 2023-04-20 ENCOUNTER — Ambulatory Visit: Payer: Medicare HMO

## 2023-04-20 ENCOUNTER — Encounter
Admission: RE | Disposition: A | Discharge status: Home / Self Care | Payer: Self-pay | Source: Home / Self Care | Attending: Urology

## 2023-04-20 ENCOUNTER — Other Ambulatory Visit: Payer: Self-pay

## 2023-04-20 ENCOUNTER — Encounter: Payer: Self-pay | Admitting: Urology

## 2023-04-20 ENCOUNTER — Ambulatory Visit: Payer: Medicare HMO | Admitting: Urgent Care

## 2023-04-20 ENCOUNTER — Ambulatory Visit
Admission: RE | Admit: 2023-04-20 | Discharge: 2023-04-20 | Disposition: A | Discharge status: Home / Self Care | Payer: Medicare HMO | Attending: Urology | Admitting: Urology

## 2023-04-20 ENCOUNTER — Ambulatory Visit: Payer: Self-pay | Admitting: Urgent Care

## 2023-04-20 DIAGNOSIS — Z6835 Body mass index (BMI) 35.0-35.9, adult: Secondary | ICD-10-CM | POA: Insufficient documentation

## 2023-04-20 DIAGNOSIS — N201 Calculus of ureter: Secondary | ICD-10-CM | POA: Diagnosis present

## 2023-04-20 DIAGNOSIS — E66813 Obesity, class 3: Secondary | ICD-10-CM | POA: Diagnosis not present

## 2023-04-20 DIAGNOSIS — K5730 Diverticulosis of large intestine without perforation or abscess without bleeding: Secondary | ICD-10-CM | POA: Insufficient documentation

## 2023-04-20 DIAGNOSIS — Z7901 Long term (current) use of anticoagulants: Secondary | ICD-10-CM | POA: Insufficient documentation

## 2023-04-20 DIAGNOSIS — I4891 Unspecified atrial fibrillation: Secondary | ICD-10-CM | POA: Insufficient documentation

## 2023-04-20 DIAGNOSIS — I10 Essential (primary) hypertension: Secondary | ICD-10-CM | POA: Insufficient documentation

## 2023-04-20 DIAGNOSIS — Z950 Presence of cardiac pacemaker: Secondary | ICD-10-CM | POA: Insufficient documentation

## 2023-04-20 DIAGNOSIS — G4730 Sleep apnea, unspecified: Secondary | ICD-10-CM | POA: Diagnosis not present

## 2023-04-20 HISTORY — PX: CYSTOSCOPY/URETEROSCOPY/HOLMIUM LASER/STENT PLACEMENT: SHX6546

## 2023-04-20 SURGERY — Surgical Case
Anesthesia: *Unknown

## 2023-04-20 SURGERY — CYSTOSCOPY/URETEROSCOPY/HOLMIUM LASER/STENT PLACEMENT
Anesthesia: General | Site: Ureter | Laterality: Right

## 2023-04-20 MED ORDER — DEXAMETHASONE SODIUM PHOSPHATE 10 MG/ML IJ SOLN
INTRAMUSCULAR | Status: DC | PRN
  Administered 2023-04-20: 5 mg via INTRAVENOUS

## 2023-04-20 MED ORDER — FENTANYL CITRATE (PF) 100 MCG/2ML IJ SOLN
INTRAMUSCULAR | Status: DC | PRN
  Administered 2023-04-20 (×4): 25 ug via INTRAVENOUS

## 2023-04-20 MED ORDER — TROSPIUM CHLORIDE 20 MG PO TABS: 20.0000 mg | ORAL_TABLET | Freq: Two times a day (BID) | ORAL | 0 refills | Status: DC | PRN

## 2023-04-20 MED ORDER — LIDOCAINE HCL (PF) 2 % IJ SOLN
INTRAMUSCULAR | Status: AC
  Filled 2023-04-20: qty 5

## 2023-04-20 MED ORDER — LACTATED RINGERS IV SOLN: INTRAVENOUS | Status: DC

## 2023-04-20 MED ORDER — PROPOFOL 10 MG/ML IV BOLUS
INTRAVENOUS | Status: AC
  Filled 2023-04-20: qty 20

## 2023-04-20 MED ORDER — FENTANYL CITRATE (PF) 100 MCG/2ML IJ SOLN
INTRAMUSCULAR | Status: AC
  Filled 2023-04-20: qty 2

## 2023-04-20 MED ORDER — FENTANYL CITRATE (PF) 100 MCG/2ML IJ SOLN: 25.0000 ug | INTRAMUSCULAR | Status: DC | PRN

## 2023-04-20 MED ORDER — CEFAZOLIN SODIUM-DEXTROSE 2-4 GM/100ML-% IV SOLN
2.0000 g | INTRAVENOUS | Status: AC
  Administered 2023-04-20: 2 g via INTRAVENOUS

## 2023-04-20 MED ORDER — LIDOCAINE HCL (CARDIAC) PF 100 MG/5ML IV SOSY
PREFILLED_SYRINGE | INTRAVENOUS | Status: DC | PRN
  Administered 2023-04-20: 100 mg via INTRAVENOUS

## 2023-04-20 MED ORDER — OXYCODONE HCL 5 MG PO TABS: 5.0000 mg | ORAL_TABLET | Freq: Once | ORAL | Status: DC | PRN

## 2023-04-20 MED ORDER — ONDANSETRON HCL 4 MG/2ML IJ SOLN: 4.0000 mg | Freq: Once | INTRAMUSCULAR | Status: DC | PRN

## 2023-04-20 MED ORDER — CEFAZOLIN SODIUM-DEXTROSE 2-4 GM/100ML-% IV SOLN
INTRAVENOUS | Status: AC
  Filled 2023-04-20: qty 100

## 2023-04-20 MED ORDER — ONDANSETRON HCL 4 MG/2ML IJ SOLN
INTRAMUSCULAR | Status: AC
  Filled 2023-04-20: qty 2

## 2023-04-20 MED ORDER — CHLORHEXIDINE GLUCONATE 0.12 % MT SOLN
15.0000 mL | Freq: Once | OROMUCOSAL | Status: AC
  Administered 2023-04-20: 15 mL via OROMUCOSAL

## 2023-04-20 MED ORDER — OXYBUTYNIN CHLORIDE 5 MG PO TABS
5.0000 mg | ORAL_TABLET | Freq: Once | ORAL | Status: AC
  Administered 2023-04-20: 5 mg via ORAL
  Filled 2023-04-20: qty 1

## 2023-04-20 MED ORDER — PHENYLEPHRINE 80 MCG/ML (10ML) SYRINGE FOR IV PUSH (FOR BLOOD PRESSURE SUPPORT)
PREFILLED_SYRINGE | INTRAVENOUS | Status: AC
  Filled 2023-04-20: qty 10

## 2023-04-20 MED ORDER — ONDANSETRON HCL 4 MG/2ML IJ SOLN
INTRAMUSCULAR | Status: DC | PRN
  Administered 2023-04-20: 4 mg via INTRAVENOUS

## 2023-04-20 MED ORDER — ACETAMINOPHEN 10 MG/ML IV SOLN
INTRAVENOUS | Status: DC | PRN
  Administered 2023-04-20: 1000 mg via INTRAVENOUS

## 2023-04-20 MED ORDER — CHLORHEXIDINE GLUCONATE 0.12 % MT SOLN
OROMUCOSAL | Status: AC
  Filled 2023-04-20: qty 15

## 2023-04-20 MED ORDER — PROPOFOL 10 MG/ML IV BOLUS
INTRAVENOUS | Status: DC | PRN
  Administered 2023-04-20: 50 mg via INTRAVENOUS
  Administered 2023-04-20: 150 mg via INTRAVENOUS

## 2023-04-20 MED ORDER — ORAL CARE MOUTH RINSE
15.0000 mL | Freq: Once | OROMUCOSAL | Status: AC
Start: 2023-04-20 — End: 2023-04-20

## 2023-04-20 MED ORDER — SODIUM CHLORIDE 0.9 % IR SOLN
Status: DC | PRN
  Administered 2023-04-20: 3000 mL

## 2023-04-20 MED ORDER — IOHEXOL 180 MG/ML  SOLN
INTRAMUSCULAR | Status: DC | PRN
  Administered 2023-04-20 (×2): 10 mL

## 2023-04-20 MED ORDER — OXYCODONE HCL 5 MG/5ML PO SOLN: 5.0000 mg | Freq: Once | ORAL | Status: DC | PRN

## 2023-04-20 MED ORDER — OXYBUTYNIN CHLORIDE 5 MG PO TABS
ORAL_TABLET | ORAL | Status: AC
  Filled 2023-04-20: qty 1

## 2023-04-20 MED ORDER — ACETAMINOPHEN 10 MG/ML IV SOLN
INTRAVENOUS | Status: AC
  Filled 2023-04-20: qty 100

## 2023-04-20 SURGICAL SUPPLY — 27 items
BAG DRAIN SIEMENS DORNER NS (MISCELLANEOUS) ×1 IMPLANT
BASKET LASER NITINOL 1.9FR (BASKET) IMPLANT
BASKET ZERO TIP 1.9FR (BASKET) IMPLANT
BRUSH SCRUB EZ 1% IODOPHOR (MISCELLANEOUS) ×1 IMPLANT
BRUSH SCRUB EZ 4% CHG (MISCELLANEOUS) IMPLANT
CATH URET FLEX-TIP 2 LUMEN 10F (CATHETERS) IMPLANT
CATH URETL OPEN END 6X70 (CATHETERS) IMPLANT
CNTNR URN SCR LID CUP LEK RST (MISCELLANEOUS) IMPLANT
DRAPE UTILITY 15X26 TOWEL STRL (DRAPES) ×1 IMPLANT
FIBER LASER MOSES 200 DFL (Laser) IMPLANT
GLOVE BIOGEL PI IND STRL 7.5 (GLOVE) ×1 IMPLANT
GOWN STRL REUS W/ TWL LRG LVL3 (GOWN DISPOSABLE) ×1 IMPLANT
GOWN STRL REUS W/ TWL XL LVL3 (GOWN DISPOSABLE) ×1 IMPLANT
GUIDEWIRE GREEN .038 145CM (MISCELLANEOUS) IMPLANT
GUIDEWIRE STR DUAL SENSOR (WIRE) ×1 IMPLANT
IV NS IRRIG 3000ML ARTHROMATIC (IV SOLUTION) ×1 IMPLANT
KIT TURNOVER CYSTO (KITS) ×1 IMPLANT
PACK CYSTO AR (MISCELLANEOUS) ×1 IMPLANT
SET CYSTO W/LG BORE CLAMP LF (SET/KITS/TRAYS/PACK) ×1 IMPLANT
SHEATH NAVIGATOR HD 12/14X36 (SHEATH) IMPLANT
STENT URET 6FRX24 CONTOUR (STENTS) IMPLANT
STENT URET 6FRX26 CONTOUR (STENTS) IMPLANT
SURGILUBE 2OZ TUBE FLIPTOP (MISCELLANEOUS) ×1 IMPLANT
SYR TOOMEY IRRIG 70ML (MISCELLANEOUS) ×1
SYRINGE TOOMEY IRRIG 70ML (MISCELLANEOUS) IMPLANT
VALVE UROSEAL ADJ ENDO (VALVE) IMPLANT
WATER STERILE IRR 500ML POUR (IV SOLUTION) ×1 IMPLANT

## 2023-04-20 NOTE — Interval H&P Note (Signed)
History and Physical Interval Note:  04/20/2023 10:41 AM  Tyler Ritter.  has presented today for surgery, with the diagnosis of Right Ureteral Stone.  The various methods of treatment have been discussed with the patient and family. After consideration of risks, benefits and other options for treatment, the patient has consented to  Procedure(s): CYSTOSCOPY/URETEROSCOPY/HOLMIUM LASER/STENT PLACEMENT (Right) as a surgical intervention.  The patient's history has been reviewed, patient examined, no change in status, stable for surgery.  I have reviewed the patient's chart and labs.  Questions were answered to the patient's satisfaction.    CV:RRR Lungs:clear  Riki Altes

## 2023-04-20 NOTE — Transfer of Care (Signed)
Immediate Anesthesia Transfer of Care Note  Patient: Tyler Ritter.  Procedure(s) Performed: CYSTOSCOPY/URETEROSCOPY/HOLMIUM LASER/STENT PLACEMENT (Right: Ureter)  Patient Location: PACU  Anesthesia Type:General  Level of Consciousness: awake, alert , and oriented  Airway & Oxygen Therapy: Patient Spontanous Breathing and Patient connected to face mask oxygen  Post-op Assessment: Report given to RN and Post -op Vital signs reviewed and stable  Post vital signs: Reviewed and stable  Last Vitals:  Vitals Value Taken Time  BP 140/67 04/20/23 1246  Temp    Pulse 60 04/20/23 1250  Resp 13 04/20/23 1250  SpO2 97 % 04/20/23 1250  Vitals shown include unfiled device data.  Last Pain:  Vitals:   04/20/23 0854  TempSrc: Temporal  PainSc: 0-No pain         Complications: No notable events documented.

## 2023-04-20 NOTE — Anesthesia Procedure Notes (Signed)
Procedure Name: LMA Insertion Date/Time: 04/20/2023 11:14 AM  Performed by: Hezzie Bump, CRNAPre-anesthesia Checklist: Patient identified, Patient being monitored, Timeout performed, Emergency Drugs available and Suction available Patient Re-evaluated:Patient Re-evaluated prior to induction Oxygen Delivery Method: Circle system utilized Preoxygenation: Pre-oxygenation with 100% oxygen Induction Type: IV induction Ventilation: Mask ventilation without difficulty LMA: LMA inserted and LMA with gastric port inserted LMA Size: 5.0 Tube type: Oral Number of attempts: 1 Placement Confirmation: positive ETCO2 and breath sounds checked- equal and bilateral Tube secured with: Tape Dental Injury: Teeth and Oropharynx as per pre-operative assessment

## 2023-04-20 NOTE — Op Note (Signed)
Preoperative diagnosis:  Right ureteral calculus  Postoperative diagnosis:  Right ureteral calculus  Procedure:  Cystoscopy Right ureteroscopy and stone removal Ureteroscopic laser lithotripsy Right ureteral stent placement (68F/24 cm) Right retrograde pyelography with interpretation  Surgeon: Lorin Picket C. Kariah Loredo, M.D.  Anesthesia: General  Complications: None  Intraoperative findings:  Cystoscopy: Urethra normal in caliber without stricture; prostate with moderate lateral lobe enlargement, prominent hypervascularity and elevated bladder neck.  Bladder mucosa without solid or papillary lesions; UOs normal-appearing bilaterally. Ureteroscopy: No ureteral mucosal abnormalities.  Calculus identified near the junction of the proximal/mid ureter.  Stone dusted with a 200 m Moses holmium laser fiber at 0.3J/40 Hz.  Due to stone hardness significantly sized stone fragments were encountered and removed via basket Right retrograde pyelography demonstrated a filling defect within the lower proximal ureter near the mid ureteral junction consistent with the patient's known calculus. Right retrograde pyelography post procedure showed no filling defects, stone fragments or contrast extravasation  EBL: Minimal  Specimens: Calculus fragments for analysis   Indication: Tyler Ritter. is a 82 y.o. male with renal colic secondary to a 7 mm right upper proximal ureteral calculus. After reviewing the management options for treatment, the patient elected to proceed with the above surgical procedure(s). We have discussed the potential benefits and risks of the procedure, side effects of the proposed treatment, the likelihood of the patient achieving the goals of the procedure, and any potential problems that might occur during the procedure or recuperation. Informed consent has been obtained.  Description of procedure:  The patient was taken to the operating room and general anesthesia was  induced.  The patient was placed in the dorsal lithotomy position, prepped and draped in the usual sterile fashion, and preoperative antibiotics were administered. A preoperative time-out was performed.   A 21 French cystoscope was lubricated, placed per urethra and advanced into the bladder under direct vision with findings as described above.    Attention was directed to the right ureteral orifice and a 0.038 Sensor wire was then advanced up the ureter into the renal pelvis under fluoroscopic guidance.  The calculus appeared to have migrated on fluoroscopy to the lower proximal ureter near the right L5 transverse process.  The cystoscope was removed and a dual-lumen ureteral catheter was placed over the guidewire and positioned in the distal ureter.  Retrograde pyelogram was then performed with Omnipaque contrast with findings described above.  The dual-lumen catheter was removed and a 4.5 Fr semirigid ureteroscope was then advanced into the ureter next to the guidewire and the calculus was identified.  Due to significant upward deflection on the mid ureter the calculus was unable to be visualized.  A second Sensor wire was placed through the ureteroscope and advanced to the renal pelvis under fluoroscopic guidance followed by ureteroscope removal.  A single channel digital flexible ureteroscope was then advanced over the wire and was able to be advanced into the distal ureter however would not advance beyond the midportion of the distal ureter.  The Sensor wire was replaced with a Super Stiff wire and the ureteroscope was able to be advanced to the level of the stone.  The stone was dusted/fragmented as described above.  Several smaller fragments were removed with a 1.9 [place].  The flexible ureteroscope was unable to be advanced back into the ureter.  The second Sensor wire was replaced through a dual-lumen catheter followed by readvancement of the flexible ureteroscope.  1 larger  remaining fragment was placed in  the escape basket and was able to be brought to the upper distal ureter before resistance was met.  The basket was disassembled and the ureteroscope was removed.  The 4.5 French semirigid ureteroscope was then advanced and the calculus was further fragmented with fragment removal via 1.9 French 0 tip basket.  The ureteroscope was then able to be advanced to the lower portion of the mid ureter and no further fragments were seen.  Retrograde pyelogram was then performed through the scope with findings described above.  The cystoscope was repassed and the bladder was drained and several small fragments were removed from the bladder and sent for analysis.  The cystoscope was removed and a 60F/24 cm Contour ureteral stent was placed on fluoroscopic guidance.  The proximal curl was in an upper pole calyx and the distal end the stent was well-positioned in the bladder.  The bladder was then emptied and the procedure ended.  The patient appeared to tolerate the procedure well and without complications.  After anesthetic reversal the patient was transported to the PACU in stable condition.   Plan: Office appointment for cystoscopy/stent removal and ~ 7 days   Irineo Axon, MD

## 2023-04-20 NOTE — Anesthesia Preprocedure Evaluation (Signed)
Anesthesia Evaluation  Patient identified by MRN, date of birth, ID band Patient awake    Reviewed: Allergy & Precautions, NPO status , Patient's Chart, lab work & pertinent test results  Airway Mallampati: III  TM Distance: >3 FB Neck ROM: Full    Dental  (+) Teeth Intact, Partial Lower   Pulmonary neg pulmonary ROS, sleep apnea and Continuous Positive Airway Pressure Ventilation    Pulmonary exam normal  + decreased breath sounds      Cardiovascular Exercise Tolerance: Good hypertension, Pt. on medications negative cardio ROS Normal cardiovascular exam+ dysrhythmias Atrial Fibrillation + pacemaker  Rhythm:Regular Rate:Tachycardia     Neuro/Psych negative neurological ROS  negative psych ROS   GI/Hepatic negative GI ROS, Neg liver ROS,,,  Endo/Other  negative endocrine ROS  Class 3 obesity  Renal/GU negative Renal ROS     Musculoskeletal  (+) Arthritis ,    Abdominal  (+) + obese  Peds  Hematology negative hematology ROS (+)   Anesthesia Other Findings Past Medical History: No date: Arthritis     Comment:  hands, lower back No date: Atrial fibrillation (HCC) No date: Central hearing loss No date: Colon polyp No date: Dysrhythmia No date: Hemorrhoid No date: Hydrocele No date: Hypertension 2012, 2013, 2015,2017: Skin cancer     Comment:  basel cell 2000: Sleep apnea     Comment:  Uses Cpap machine No date: Wears hearing aid in both ears  Past Surgical History: 10/24/2016: CATARACT EXTRACTION W/PHACO; Left     Comment:  Procedure: CATARACT EXTRACTION PHACO AND INTRAOCULAR               LENS PLACEMENT (IOC)  Left;  Surgeon: Lockie Mola, MD;  Location: [facility];  Service:               Ophthalmology;  Laterality: Left;  sleep apnea 02/01/2017: CATARACT EXTRACTION W/PHACO; Right     Comment:  Procedure: CATARACT EXTRACTION PHACO AND INTRAOCULAR               LENS PLACEMENT  (IOC) COMPLICATED RIGHT;  Surgeon:               Lockie Mola, MD;  Location: [facility];              Service: Ophthalmology;  Laterality: Right;  sleep               apnea requests early 2007: CHOLECYSTECTOMY No date: CIRCUMCISION, NON-NEWBORN 2012: COLONOSCOPY 03/22/2016: COLONOSCOPY WITH PROPOFOL; N/A     Comment:  Procedure: COLONOSCOPY WITH PROPOFOL;  Surgeon: Earline Mayotte, MD;  Location: [facility] ENDOSCOPY;  Service:               Endoscopy;  Laterality: N/A; 01/27/2021: COLONOSCOPY WITH PROPOFOL; N/A     Comment:  Procedure: COLONOSCOPY WITH PROPOFOL;  Surgeon: Earline Mayotte, MD;  Location: [facility] ENDOSCOPY;  Service:               Endoscopy;  Laterality: N/A; 1952: HERNIA REPAIR No date: HYDROCELE EXCISION / REPAIR No date: MOHS SURGERY 01/19/2022: PACEMAKER LEADLESS INSERTION; N/A     Comment:  Procedure: PACEMAKER LEADLESS INSERTION;  Surgeon:  Marcina Millard, MD;  Location: [facility] INVASIVE CV               LAB;  Service: Cardiovascular;  Laterality: N/A; No date: TONSILLECTOMY  BMI    Body Mass Index: 35.43 kg/m      Reproductive/Obstetrics negative OB ROS                             Anesthesia Physical Anesthesia Plan  ASA: 3  Anesthesia Plan: General   Post-op Pain Management:    Induction: Intravenous  PONV Risk Score and Plan: Dexamethasone, Ondansetron, Midazolam and Treatment may vary due to age or medical condition  Airway Management Planned: LMA  Additional Equipment:   Intra-op Plan:   Post-operative Plan: Extubation in OR  Informed Consent: I have reviewed the patients History and Physical, chart, labs and discussed the procedure including the risks, benefits and alternatives for the proposed anesthesia with the patient or authorized representative who has indicated his/her understanding and acceptance.     Dental Advisory Given  Plan Discussed with:  CRNA and Surgeon  Anesthesia Plan Comments:        Anesthesia Quick Evaluation

## 2023-04-20 NOTE — Discharge Instructions (Signed)
DISCHARGE INSTRUCTIONS FOR KIDNEY STONE/URETERAL STENT   MEDICATIONS:  1. Resume all your other meds from home.  2.  AZO (over-the-counter) can help with the burning/stinging when you urinate. 3.  Trospium is for bladder/stent irritation, Rx was sent to your pharmacy.  ACTIVITY:  1. May resume regular activities in 24 hours. 2. No driving while on narcotic pain medications  3. Drink plenty of water  4. Continue to walk at home - you can still get blood clots when you are at home, so keep active, but don't over do it.  5. May return to work/school tomorrow or when you feel ready    SIGNS/SYMPTOMS TO CALL:  Common postoperative symptoms include urinary frequency, urgency, bladder spasm and blood in the urine  Please call us if you have a fever greater than 101.5, uncontrolled nausea/vomiting, uncontrolled pain, dizziness, unable to urinate, excessively bloody urine, chest pain, shortness of breath, leg swelling, leg pain, or any other concerns or questions.   You can reach [place] at [phone].   FOLLOW-UP:  1. You will be contacted for a follow-up appointment for stent removal in 7-10 days 2.  You may resume your Xarelto 04/21/2023

## 2023-04-21 ENCOUNTER — Encounter: Payer: Self-pay | Admitting: Urology

## 2023-04-21 NOTE — Anesthesia Postprocedure Evaluation (Signed)
Anesthesia Post Note  Patient: Tyler Ritter.  Procedure(s) Performed: CYSTOSCOPY/URETEROSCOPY/HOLMIUM LASER/STENT PLACEMENT (Right: Ureter)  Patient location during evaluation: PACU Anesthesia Type: General Level of consciousness: awake and awake and alert Pain management: satisfactory to patient Vital Signs Assessment: post-procedure vital signs reviewed and stable Respiratory status: spontaneous breathing and nonlabored ventilation Cardiovascular status: blood pressure returned to baseline Anesthetic complications: no   No notable events documented.   Last Vitals:  Vitals:   04/20/23 1333 04/20/23 1343  BP: (!) 165/87 (!) 178/88  Pulse: 60 61  Resp: 13 18  Temp: 36.6 C   SpO2: 100% 98%    Last Pain:  Vitals:   04/20/23 1343  TempSrc: Temporal  PainSc:                  VAN STAVEREN,Dawnielle Christiana

## 2023-04-25 LAB — CALCULI, WITH PHOTOGRAPH (CLINICAL LAB)
Calcium Oxalate Dihydrate: 10 %
Calcium Oxalate Monohydrate: 90 %
Weight Calculi: 30 mg

## 2023-05-01 ENCOUNTER — Ambulatory Visit: Payer: Medicare HMO | Admitting: Urology

## 2023-05-01 ENCOUNTER — Encounter: Payer: Medicare HMO | Admitting: Urology

## 2023-05-01 ENCOUNTER — Telehealth: Payer: Self-pay

## 2023-05-01 VITALS — BP 122/74 | HR 76 | Ht 71.0 in | Wt 241.0 lb

## 2023-05-01 DIAGNOSIS — N201 Calculus of ureter: Secondary | ICD-10-CM

## 2023-05-01 DIAGNOSIS — Z466 Encounter for fitting and adjustment of urinary device: Secondary | ICD-10-CM | POA: Diagnosis not present

## 2023-05-01 DIAGNOSIS — Z87442 Personal history of urinary calculi: Secondary | ICD-10-CM

## 2023-05-01 MED ORDER — SULFAMETHOXAZOLE-TRIMETHOPRIM 800-160 MG PO TABS
1.0000 | ORAL_TABLET | Freq: Once | ORAL | Status: AC
  Administered 2023-05-01: 1 via ORAL

## 2023-05-01 NOTE — Telephone Encounter (Signed)
 Talked to patient . He now states there is no more blood clots . He is able to urinate. The blood a  light drop with urinate now.

## 2023-05-01 NOTE — Telephone Encounter (Signed)
 Seen today for stent removal by SCS.   30 minutes ago went  to urinate and saw blood clot size of the palm of hand. Dark red blood.   Pt states he stands up and drips blood. No pain.  Has had 16 oz of water since 1130.    Pls advise.

## 2023-05-07 NOTE — Progress Notes (Signed)
 Indications: Patient is 83 y.o., who is s/p ureteroscopic removal of a left proximal ureteral calculus 04/20/2023.  The patient is presenting today for stent removal. No post op complications; moderate stent sxs  Procedure:  Flexible Cystoscopy with stent removal (47689)  Timeout was performed and the correct patient, procedure and participants were identified.    Description:  The patient was prepped and draped in the usual sterile fashion. Flexible cystosopy was performed.  The stent was visualized, grasped, and removed intact without difficulty. The patient tolerated the procedure well.  A single dose of oral antibiotics was given.  Complications:  None  Plan: Stone analysis mixed CaOx General stone prevention guidelines discussed and provided literature Also c/o LUTS but not bothersome enough for medical management

## 2023-11-01 ENCOUNTER — Ambulatory Visit
Admission: RE | Admit: 2023-11-01 | Discharge: 2023-11-01 | Disposition: A | Discharge status: Home / Self Care | Source: Ambulatory Visit | Attending: Urology | Admitting: Urology

## 2023-11-01 ENCOUNTER — Ambulatory Visit: Payer: Self-pay | Admitting: Urology

## 2023-11-01 ENCOUNTER — Ambulatory Visit
Admission: RE | Admit: 2023-11-01 | Discharge: 2023-11-01 | Disposition: A | Discharge status: Home / Self Care | Attending: Urology | Admitting: Urology

## 2023-11-01 ENCOUNTER — Encounter: Payer: Self-pay | Admitting: Urology

## 2023-11-01 VITALS — BP 163/93 | HR 92 | Ht 71.0 in | Wt 248.0 lb

## 2023-11-01 DIAGNOSIS — Z87442 Personal history of urinary calculi: Secondary | ICD-10-CM | POA: Diagnosis present

## 2023-11-01 DIAGNOSIS — N200 Calculus of kidney: Secondary | ICD-10-CM | POA: Diagnosis not present

## 2023-11-01 NOTE — Progress Notes (Signed)
 11/01/2023 9:22 AM   Tyler Ritter Tyler Ritter August 19, 1940 [id number]  Referring provider: Lenon Layman ORN, MD [address] [facility] [place],  [place] [address]  Chief Complaint  Patient presents with   Nephrolithiasis   Urologic history: 1.  Nephrolithiasis Ureteroscopic removal 7 mm right proximal ureteral calculus 04/20/2023 Small, nonobstructing bilateral renal calculi on CT Stone analysis: CaOxMono/CaOxDi 90/10   HPI: Tyler Ritter. is a 83 y.o. male presents for 23-month follow-up.  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   Sleep apnea 2000   Uses Cpap machine   Wears hearing aid in both ears     Surgical History: Past Surgical History:  Procedure Laterality Date   CATARACT EXTRACTION W/PHACO Left 10/24/2016   Procedure: CATARACT EXTRACTION PHACO AND INTRAOCULAR LENS PLACEMENT (IOC)  Left;  Surgeon: Mittie Gaskin, MD;  Location: [facility];  Service: Ophthalmology;  Laterality: Left;  sleep apnea   CATARACT EXTRACTION W/PHACO Right 02/01/2017   Procedure: CATARACT EXTRACTION PHACO AND INTRAOCULAR LENS PLACEMENT (IOC) COMPLICATED RIGHT;  Surgeon: Mittie Gaskin, MD;  Location: [facility];  Service: Ophthalmology;  Laterality: Right;  sleep apnea requests early   CHOLECYSTECTOMY  2007   CIRCUMCISION, NON-NEWBORN     COLONOSCOPY  2012   COLONOSCOPY WITH PROPOFOL  N/A 03/22/2016   Procedure: COLONOSCOPY WITH PROPOFOL ;  Surgeon: Reyes ORN Cota, MD;  Location: [facility];  Service: Endoscopy;  Laterality:  N/A;   COLONOSCOPY WITH PROPOFOL  N/A 01/27/2021   Procedure: COLONOSCOPY WITH PROPOFOL ;  Surgeon: Cota Reyes ORN, MD;  Location: [facility];  Service: Endoscopy;  Laterality: N/A;   CYSTOSCOPY/URETEROSCOPY/HOLMIUM LASER/STENT PLACEMENT Right 04/20/2023   Procedure: CYSTOSCOPY/URETEROSCOPY/HOLMIUM LASER/STENT PLACEMENT;  Surgeon: Twylla Glendia BROCKS, MD;  Location: [facility];  Service: Urology;  Laterality: Right;   HERNIA REPAIR  1952   HYDROCELE EXCISION / REPAIR     MOHS SURGERY     PACEMAKER LEADLESS INSERTION N/A 01/19/2022   Procedure: PACEMAKER LEADLESS INSERTION;  Surgeon: Ammon Blunt, MD;  Location: [facility];  Service: Cardiovascular;  Laterality: N/A;   TONSILLECTOMY      Home Medications:  Allergies as of 11/01/2023   No Known Allergies      Medication List        Accurate as of November 01, 2023  9:22 AM. If you have any questions, ask your nurse or doctor.          STOP taking these medications    multivitamin tablet Stopped by: Glendia BROCKS Twylla   rivaroxaban  20 MG Tabs tablet Commonly known as: XARELTO  Stopped by: Glendia BROCKS Twylla   trospium  20 MG tablet Commonly known as: SANCTURA  Stopped by: Glendia BROCKS Twylla       TAKE these medications    acetaminophen  500 MG tablet Commonly known as: TYLENOL  Take 500 mg by mouth every 6 (six) hours as needed for mild pain (pain score 1-3) or moderate pain (pain score 4-6). What changed: Another medication with the same name was  removed. Continue taking this medication, and follow the directions you see here. Changed by: [name]   Alpha-Lipoic Acid 100 MG Caps Take 100 mg by mouth daily.   apixaban 5 MG Tabs tablet Commonly known as: ELIQUIS Take 5 mg by mouth.   ascorbic acid 500 MG tablet Commonly known as: VITAMIN C Take 500 mg by mouth daily.   azelastine 0.1 % nasal spray Commonly known as: ASTELIN Place 2 sprays into both nostrils 2 (two) times daily as needed for rhinitis.  Use in each nostril as directed   calcium citrate-vitamin D 315-200 MG-UNIT tablet Commonly known as: CITRACAL+D Take 1 tablet by mouth daily.   diltiazem 120 MG 24 hr capsule Commonly known as: DILACOR XR Take 120 mg by mouth daily.   fluticasone 50 MCG/ACT nasal spray Commonly known as: FLONASE Place 2 sprays into both nostrils daily as needed for allergies.   losartan -hydrochlorothiazide  100-12.5 MG tablet Commonly known as: HYZAAR Take 1 tablet by mouth daily.   MAG-DELAY PO Take 535 mg by mouth daily. Slow mag   Systane Complete 0.6 % Soln Generic drug: Propylene Glycol Place 1 drop into both eyes 2 (two) times daily.   Vitamin D3 50 MCG (2000 UT) capsule Take 2,000 Units by mouth daily.        Allergies: No Known Allergies  Family History: Family History  Problem Relation Age of Onset   Cancer Father 70       colon    Social History:  reports that he has never smoked. He has never used smokeless tobacco. He reports that he does not drink alcohol and does not use drugs.   Physical Exam: BP (!) 163/93   Pulse 92   Ht 5' 11 (1.803 m)   Wt 248 lb (112.5 kg)   BMI 34.59 kg/m   Constitutional:  Alert and oriented, No acute distress. HEENT: [place] AT Respiratory: Normal respiratory effort, no increased work of breathing. Psychiatric: Normal mood and affect.   Pertinent Imaging: KUB performed this morning prior to appointment was personally reviewed and interpreted.  No calcifications seen overlying the renal outlines or expected course of the ureter   Assessment & Plan:    1.  Nephrolithiasis Small, bilateral nonobstructing renal calculi on CT; not visualized on KUB 1 year follow-up with KUB Instructed call earlier for recurrent flank pain/renal colic   [name], MD  [facility] [address], [address] [place], [place] [address] [phone]

## 2024-10-28 ENCOUNTER — Other Ambulatory Visit

## 2024-10-30 ENCOUNTER — Ambulatory Visit: Admitting: Urology
